# Patient Record
Sex: Male | Born: 1935 | Race: White | Hispanic: No | Smoking: Never smoker
Health system: Southern US, Community
[De-identification: ages and names within clinical notes are randomized; demographics above are authoritative.]

## PROBLEM LIST (undated history)

## (undated) DIAGNOSIS — Z9119 Patient's noncompliance with other medical treatment and regimen: Secondary | ICD-10-CM

## (undated) DIAGNOSIS — K219 Gastro-esophageal reflux disease without esophagitis: Secondary | ICD-10-CM

## (undated) DIAGNOSIS — E785 Hyperlipidemia, unspecified: Secondary | ICD-10-CM

## (undated) DIAGNOSIS — I10 Essential (primary) hypertension: Secondary | ICD-10-CM

## (undated) DIAGNOSIS — N182 Chronic kidney disease, stage 2 (mild): Secondary | ICD-10-CM

## (undated) DIAGNOSIS — R001 Bradycardia, unspecified: Secondary | ICD-10-CM

## (undated) DIAGNOSIS — I480 Paroxysmal atrial fibrillation: Secondary | ICD-10-CM

## (undated) DIAGNOSIS — Z91199 Patient's noncompliance with other medical treatment and regimen due to unspecified reason: Secondary | ICD-10-CM

## (undated) HISTORY — DX: Hyperlipidemia, unspecified: E78.5

## (undated) HISTORY — DX: Bradycardia, unspecified: R00.1

## (undated) HISTORY — DX: Essential (primary) hypertension: I10

## (undated) HISTORY — DX: Gastro-esophageal reflux disease without esophagitis: K21.9

---

## 2001-05-03 HISTORY — PX: COLONOSCOPY: SHX174

## 2002-04-10 ENCOUNTER — Encounter (INDEPENDENT_AMBULATORY_CARE_PROVIDER_SITE_OTHER): Payer: Self-pay | Admitting: Specialist

## 2002-04-10 ENCOUNTER — Ambulatory Visit (HOSPITAL_COMMUNITY): Admission: RE | Admit: 2002-04-10 | Discharge: 2002-04-10 | Payer: Self-pay | Admitting: *Deleted

## 2005-05-03 DIAGNOSIS — I480 Paroxysmal atrial fibrillation: Secondary | ICD-10-CM

## 2005-05-03 HISTORY — DX: Paroxysmal atrial fibrillation: I48.0

## 2005-06-17 ENCOUNTER — Encounter: Payer: Self-pay | Admitting: Internal Medicine

## 2005-06-17 ENCOUNTER — Inpatient Hospital Stay (HOSPITAL_COMMUNITY): Admission: EM | Admit: 2005-06-17 | Discharge: 2005-06-19 | Payer: Self-pay | Admitting: Internal Medicine

## 2005-06-17 ENCOUNTER — Encounter (INDEPENDENT_AMBULATORY_CARE_PROVIDER_SITE_OTHER): Payer: Self-pay | Admitting: *Deleted

## 2007-05-04 HISTORY — PX: CARDIAC CATHETERIZATION: SHX172

## 2007-07-09 ENCOUNTER — Ambulatory Visit: Payer: Self-pay | Admitting: Vascular Surgery

## 2007-07-09 ENCOUNTER — Encounter: Payer: Self-pay | Admitting: Internal Medicine

## 2007-07-09 ENCOUNTER — Inpatient Hospital Stay (HOSPITAL_COMMUNITY): Admission: EM | Admit: 2007-07-09 | Discharge: 2007-07-19 | Payer: Self-pay | Admitting: Emergency Medicine

## 2008-07-25 ENCOUNTER — Encounter: Payer: Self-pay | Admitting: Internal Medicine

## 2008-09-23 DIAGNOSIS — I498 Other specified cardiac arrhythmias: Secondary | ICD-10-CM | POA: Insufficient documentation

## 2008-09-24 ENCOUNTER — Ambulatory Visit: Payer: Self-pay | Admitting: Internal Medicine

## 2008-10-15 ENCOUNTER — Ambulatory Visit: Payer: Self-pay | Admitting: Internal Medicine

## 2008-10-15 ENCOUNTER — Ambulatory Visit: Payer: Self-pay

## 2008-11-22 ENCOUNTER — Ambulatory Visit: Payer: Self-pay | Admitting: Internal Medicine

## 2008-12-02 ENCOUNTER — Encounter: Admission: RE | Admit: 2008-12-02 | Discharge: 2008-12-02 | Payer: Self-pay | Admitting: Internal Medicine

## 2009-03-13 ENCOUNTER — Ambulatory Visit: Payer: Self-pay | Admitting: Internal Medicine

## 2009-05-03 HISTORY — PX: DOPPLER ECHOCARDIOGRAPHY: SHX263

## 2009-06-05 ENCOUNTER — Encounter: Payer: Self-pay | Admitting: Internal Medicine

## 2009-06-05 ENCOUNTER — Emergency Department (HOSPITAL_COMMUNITY): Admission: EM | Admit: 2009-06-05 | Discharge: 2009-06-05 | Payer: Self-pay | Admitting: Emergency Medicine

## 2009-06-06 ENCOUNTER — Ambulatory Visit: Payer: Self-pay | Admitting: Internal Medicine

## 2009-06-06 DIAGNOSIS — I10 Essential (primary) hypertension: Secondary | ICD-10-CM

## 2009-08-25 ENCOUNTER — Telehealth: Payer: Self-pay | Admitting: Internal Medicine

## 2009-09-08 ENCOUNTER — Ambulatory Visit: Payer: Self-pay | Admitting: Internal Medicine

## 2010-03-04 ENCOUNTER — Ambulatory Visit: Payer: Self-pay | Admitting: Internal Medicine

## 2010-05-03 HISTORY — PX: OTHER SURGICAL HISTORY: SHX169

## 2010-06-02 NOTE — Assessment & Plan Note (Signed)
Summary: per check out/sf   Visit Type:  Follow-up Primary Provider:  Dr.Pang   History of Present Illness: [name] returns today for followup.  He is a pleasant 77 yo man who still works two jobs.  He has a h/o persistent atrial fibrillation and has been maintained in NSR on flecainide.  He notes mild fatigue despite maintaining NSR.  He denies c/p or sob. Since I last saw him, his symptoms have improved but he is still working two jobs. He has purchased a bike and [place]  Current Medications (verified): 1)  Bystolic 5 Mg Tabs (Nebivolol Hcl) .... 1/2 Tablet Once Daily 2)  Sertraline Hcl 50 Mg Tabs (Sertraline Hcl) .Marland Kitchen.. 1 By Mouth Once Daily 3)  Vitamin E 600 Unit  Caps (Vitamin E) 4)  Multivitamins   Tabs (Multiple Vitamin) .Marland Kitchen.. 1 By Mouth Once Daily 5)  Flecainide Acetate 100 Mg Tabs (Flecainide Acetate) .... One By Mouth Two Times A Day 6)  Vitamin D 1000 Unit  Tabs (Cholecalciferol) .... Once Daily 7)  Prilosec Otc 20 Mg Tbec (Omeprazole Magnesium) .... Once Daily 8)  Pradaxa 150 Mg Caps (Dabigatran Etexilate Mesylate) .... Two Times A Day 9)  Bone   Tabs .... Once Daily  Allergies (verified): No Known Drug Allergies  Past History:  Past Medical History: Last updated: 09/23/2008 BRADYCARDIA (ICD-427.89) ATRIAL FIBRILLATION (ICD-427.31)    Review of Systems  The patient denies chest pain, syncope, dyspnea on exertion, and peripheral edema.    Vital Signs:  Patient profile:   77 year old male Height:      68 inches Weight:      216 pounds BMI:     32.96 Pulse rate:   52 / minute BP sitting:   122 / 70  (left arm)  Vitals Entered By: Laurance Flatten CMA (March 04, 2010 8:29 AM)  Physical Exam  General:  Well developed, well nourished, in no acute distress. Head:  normocephalic and atraumatic Eyes:  PERRLA/EOM intact; conjunctiva and lids normal. Mouth:  Teeth, gums and palate normal. Oral mucosa normal. Neck:  Neck supple,  no JVD. No masses, thyromegaly or abnormal cervical nodes. Lungs:  Clear bilaterally to auscultation.  No wheezes, rales, or rhonchi. Heart:  RRR with normal S1 and S2.  PMI is not enlarged or laterally displaced. Abdomen:  Bowel sounds positive; abdomen soft and non-tender without masses, organomegaly, or hernias noted. No hepatosplenomegaly. Msk:  Back normal, normal gait. Muscle strength and tone normal. Pulses:  pulses normal in all 4 extremities Extremities:  No clubbing or cyanosis. Neurologic:  Alert and oriented x 3.   EKG  Procedure date:  03/04/2010  Findings:      Sinus bradycardia with rate of:  52.  Impression & Recommendations:  Problem # 1:  ATRIAL FIBRILLATION (ICD-427.31) His symptoms are well controllled.  He will continue his current meds. He has stopped coumadin and is now on pradaxa. The following medications were removed from the medication list:    Coumadin 5 Mg Tabs (Warfarin sodium) .Marland KitchenMarland KitchenMarland KitchenMarland Kitchen 5 mg on mon.and fri.5 mg 10 mg other days His updated medication list for this problem includes:    Bystolic 5 Mg Tabs (Nebivolol hcl) .Marland Kitchen... 1/2 tablet once daily    Flecainide Acetate 100 Mg Tabs (Flecainide acetate) ..... One by mouth two times a day  Orders: EKG w/ Interpretation (93000)  Problem # 2:  ESSENTIAL HYPERTENSION, BENIGN (ICD-401.1) His blood pressure is well controlled.  He  will continue meds as below and maintain a low sodium diet. His updated medication list for this problem includes:    Bystolic 5 Mg Tabs (Nebivolol hcl) .Marland Kitchen... 1/2 tablet once daily  Patient Instructions: 1)  Your physician wants you to follow-up in: 12 months with Dr Court Joy will receive a reminder letter in the mail two months in advance. If you don't receive a letter, please call our office to schedule the follow-up appointment. 2)  Your physician recommends that you continue on your current medications as directed. Please refer to the Current Medication list given to you  today.

## 2010-06-02 NOTE — Letter (Signed)
Summary: [employer] Take Home Instructions  [facility] Take Home Instructions   Imported By: Roderic Ovens 06/13/2009 13:22:29  _____________________________________________________________________  External Attachment:    Type:   Image     Comment:   External Document

## 2010-06-02 NOTE — Assessment & Plan Note (Signed)
Summary: 3 month rov. sl   Visit Type:  3 mos /  OV Primary Provider:  Dr.Pang  CC:  a fib several wks ago.  History of Present Illness: William Stein returns today for followup.  He is a pleasant 77 yo man who still works two jobs.  He has a h/o persistent atrial fibrillation and has been maintained in NSR on flecainide.  He notes mild fatigue despite maintaining NSR.  He denies c/p or sob. Since I last saw him, his symptoms have improved but he is still working two jobs.   Problems Prior to Update: 1)  Essential Hypertension, Benign  (ICD-401.1) 2)  Bradycardia  (ICD-427.89) 3)  Atrial Fibrillation  (ICD-427.31)  Current Medications (verified): 1)  Bystolic 10 Mg Tabs (Nebivolol Hcl) .... Half  By Mouth Once Daily 2)  Coumadin 5 Mg Tabs (Warfarin Sodium) .... 5 Mg On Mon.and Fri.5 Mg 10 Mg Other Days 3)  Sertraline Hcl 50 Mg Tabs (Sertraline Hcl) .Marland Kitchen.. 1 By Mouth Once Daily 4)  Vitamin E 600 Unit  Caps (Vitamin E) .... 400 Unit Daily 5)  Multivitamins   Tabs (Multiple Vitamin) .Marland Kitchen.. 1 By Mouth Once Daily 6)  Flecainide Acetate 100 Mg Tabs (Flecainide Acetate) .... One By Mouth Two Times A Day 7)  Vitamin D 1000 Unit  Tabs (Cholecalciferol) .... Once Daily 8)  Prilosec Otc 20 Mg Tbec (Omeprazole Magnesium) .... Once Daily  Allergies (verified): No Known Drug Allergies  Past History:  Past Medical History: Last updated: 09/23/2008 BRADYCARDIA (ICD-427.89) ATRIAL FIBRILLATION (ICD-427.31)    Review of Systems  The patient denies chest pain, syncope, dyspnea on exertion, and peripheral edema.    Vital Signs:  Patient profile:   77 year old male Height:      68 inches Weight:      213 pounds BMI:     32.50 Pulse rate:   54 / minute BP sitting:   128 / 70  (left arm) Cuff size:   large  Vitals Entered By: William Stein (Sep 08, 2009 9:51 AM)  Physical Exam  General:  Well developed, well nourished, in no acute distress. Head:  normocephalic and atraumatic Eyes:   PERRLA/EOM intact; conjunctiva and lids normal. Mouth:  Teeth, gums and palate normal. Oral mucosa normal. Neck:  Neck supple, no JVD. No masses, thyromegaly or abnormal cervical nodes. Lungs:  Clear bilaterally to auscultation.  No wheezes, rales, or rhonchi. Heart:  RRR with normal S1 and S2.  PMI is not enlarged or laterally displaced. Abdomen:  Bowel sounds positive; abdomen soft and non-tender without masses, organomegaly, or hernias noted. No hepatosplenomegaly. Msk:  Back normal, normal gait. Muscle strength and tone normal. Pulses:  pulses normal in all 4 extremities Extremities:  No clubbing or cyanosis. Neurologic:  Alert and oriented x 3.   EKG  Procedure date:  09/08/2009  Findings:      Normal sinus rhythm with rate of:  62.  Impression & Recommendations:  Problem # 1:  ATRIAL FIBRILLATION (ICD-427.31) His symptoms are fairly well controlled for now. He will continue his current meds. His updated medication list for this problem includes:    Bystolic 10 Mg Tabs (Nebivolol hcl) ..... Half  by mouth once daily    Coumadin 5 Mg Tabs (Warfarin sodium) .Marland KitchenMarland KitchenMarland KitchenMarland Kitchen 5 mg on mon.and fri.5 mg 10 mg other days    Flecainide Acetate 100 Mg Tabs (Flecainide acetate) ..... One by mouth two times a day  Problem # 2:  BRADYCARDIA (ICD-427.89)  He appears to be asymptomatic for now despite his medical therapy. His updated medication list for this problem includes:    Bystolic 10 Mg Tabs (Nebivolol hcl) ..... Half  by mouth once daily    Coumadin 5 Mg Tabs (Warfarin sodium) .Marland KitchenMarland KitchenMarland KitchenMarland Kitchen 5 mg on mon.and fri.5 mg 10 mg other days    Flecainide Acetate 100 Mg Tabs (Flecainide acetate) ..... One by mouth two times a day  Problem # 3:  ATRIAL FIBRILLATION (ICD-427.31) He appears to be maintaining NSR.  Continue his current meds. His updated medication list for this problem includes:    Bystolic 10 Mg Tabs (Nebivolol hcl) ..... Half  by mouth once daily    Coumadin 5 Mg Tabs (Warfarin sodium) .Marland KitchenMarland KitchenMarland KitchenMarland Kitchen 5  mg on mon.and fri.5 mg 10 mg other days    Flecainide Acetate 100 Mg Tabs (Flecainide acetate) ..... One by mouth two times a day  Patient Instructions: 1)  Your physician wants you to follow-up in:   6 months with Dr Ladona Ridgel.  You will receive a reminder letter in the mail two months in advance. If you don't receive a letter, please call our office to schedule the follow-up appointment. 2)  Your physician recommends that you continue on your current medications as directed. Please refer to the Current Medication list given to you today.

## 2010-06-02 NOTE — Assessment & Plan Note (Signed)
Summary: rov per pt call/went to ER last night/lg   Visit Type:  Follow-up Primary Provider:  Dr.Pang  CC:  Pt was in ED on Wed for AF.  History of Present Illness: William Stein returns today for followup.  He is a pleasant 77 yo man who still works two jobs.  He has a h/o persistent atrial fibrillation and has been maintained in NSR on flecainide.  He notes mild fatigue despite maintaining NSR.  He denies c/p or sob.  No peripheral edema.  He recently had worsening atrial fibrillation and had to go to the ER for treatment.  He states that he had increased his Caffiene consumption and had been eating chocolate.  No other symptoms except for fatigue.  Current Medications (verified): 1)  Bystolic 10 Mg Tabs (Nebivolol Hcl) .... Half  By Mouth Once Daily 2)  Coumadin 5 Mg Tabs (Warfarin Sodium) .... As Directed 3)  Sertraline Hcl 50 Mg Tabs (Sertraline Hcl) .Marland Kitchen.. 1 By Mouth Once Daily 4)  Clonazepam Odt 0.5 Mg Tbdp (Clonazepam) .Marland Kitchen.. 1 By Mouth Once Daily 5)  Vitamin E 600 Unit  Caps (Vitamin E) .... 400 Unit Daily 6)  Multivitamins   Tabs (Multiple Vitamin) .Marland Kitchen.. 1 By Mouth Once Daily 7)  Flecainide Acetate 100 Mg Tabs (Flecainide Acetate) .... One By Mouth Two Times A Day 8)  Vitamin D 1000 Unit  Tabs (Cholecalciferol) .... Once Daily 9)  Prilosec Otc 20 Mg Tbec (Omeprazole Magnesium)  Allergies (verified): No Known Drug Allergies  Past History:  Past Medical History: Last updated: 09/23/2008 BRADYCARDIA (ICD-427.89) ATRIAL FIBRILLATION (ICD-427.31)    Review of Systems       The patient complains of dyspnea on exertion.  The patient denies chest pain, syncope, and peripheral edema.    Vital Signs:  Patient profile:   77 year old male Height:      68 inches Weight:      221 pounds BP sitting:   124 / 75  (right arm)  Physical Exam  General:  Well developed, well nourished, in no acute distress. Head:  normocephalic and atraumatic Eyes:  PERRLA/EOM intact; conjunctiva and  lids normal. Mouth:  Teeth, gums and palate normal. Oral mucosa normal. Neck:  Neck supple, no JVD. No masses, thyromegaly or abnormal cervical nodes. Lungs:  Clear bilaterally to auscultation.  No wheezes, rales, or rhonchi. Heart:  RRR with normal S1 and S2.  PMI is not enlarged or laterally displaced. Abdomen:  Bowel sounds positive; abdomen soft and non-tender without masses, organomegaly, or hernias noted. No hepatosplenomegaly. Msk:  Back normal, normal gait. Muscle strength and tone normal. Pulses:  pulses normal in all 4 extremities Extremities:  No clubbing or cyanosis. Neurologic:  Alert and oriented x 3.   EKG  Procedure date:  06/06/2009  Findings:      Sinus bradycardia with rate of:  58. LAE.  Impression & Recommendations:  Problem # 1:  ATRIAL FIBRILLATION (ICD-427.31) Assessment Deteriorated His problem has worsened.  I have outlined several treatment options including changing diet back to no caffiene, increasing flecainide, switching to another anti-arrhythmic drug or catheter ablation.  He would like to try and work on his diet.  Further I have asked him to take an additional bystolic if his heart goes back out of rhythm. His updated medication list for this problem includes:    Bystolic 10 Mg Tabs (Nebivolol hcl) ..... Half  by mouth once daily    Coumadin 5 Mg Tabs (Warfarin sodium) .Marland Kitchen... As  directed    Flecainide Acetate 100 Mg Tabs (Flecainide acetate) ..... One by mouth two times a day  Problem # 2:  BRADYCARDIA (ICD-427.89) His bradycardia is not currently symptomatic.  We will followup in several months. His updated medication list for this problem includes:    Bystolic 10 Mg Tabs (Nebivolol hcl) ..... Half  by mouth once daily    Coumadin 5 Mg Tabs (Warfarin sodium) .Marland Kitchen... As directed    Flecainide Acetate 100 Mg Tabs (Flecainide acetate) ..... One by mouth two times a day  Problem # 3:  ESSENTIAL HYPERTENSION, BENIGN (ICD-401.1) His blood pressure has  been reviewed.  He takes them at home.  He will continue on his current meds and maintain a low sodium diet. His updated medication list for this problem includes:    Bystolic 10 Mg Tabs (Nebivolol hcl) ..... Half  by mouth once daily  Patient Instructions: 1)  Your physician recommends that you schedule a follow-up appointment in: 3 months

## 2010-06-02 NOTE — Progress Notes (Signed)
Summary: pt has questions regarding b/p going up and down  Phone Note Call from Patient Call back at Home Phone [phone]   Caller: Patient Reason for Call: Talk to Nurse, Talk to Doctor Summary of Call: per pt call his b/p has been going up and down and he is concerned and has some questions. his a- fib started back up yesterday. Initial call taken by: Omer Jack,  August 25, 2009 5:02 PM  Follow-up for Phone Call        spoke with pt due to his HR being irreg the automatic cuff not picking up on BP.  He feels fine.  He will call me back if the afib doesn't break.  He drank 3 cups of regular coffee and had 2 pieces of chocolate cake prior to going out of rhythm.  He will let me know if he doesn't convert back. Dennis Bast, RN, BSN  August 25, 2009 5:24 PM

## 2010-07-08 ENCOUNTER — Emergency Department (HOSPITAL_COMMUNITY)
Admission: EM | Admit: 2010-07-08 | Discharge: 2010-07-08 | Disposition: A | Discharge status: Home / Self Care | Payer: PRIVATE HEALTH INSURANCE | Attending: Emergency Medicine | Admitting: Emergency Medicine

## 2010-07-08 ENCOUNTER — Emergency Department (HOSPITAL_COMMUNITY): Payer: PRIVATE HEALTH INSURANCE

## 2010-07-08 DIAGNOSIS — I10 Essential (primary) hypertension: Secondary | ICD-10-CM | POA: Insufficient documentation

## 2010-07-08 DIAGNOSIS — N390 Urinary tract infection, site not specified: Secondary | ICD-10-CM | POA: Insufficient documentation

## 2010-07-08 DIAGNOSIS — R5383 Other fatigue: Secondary | ICD-10-CM | POA: Insufficient documentation

## 2010-07-08 DIAGNOSIS — R5381 Other malaise: Secondary | ICD-10-CM | POA: Insufficient documentation

## 2010-07-08 DIAGNOSIS — R7309 Other abnormal glucose: Secondary | ICD-10-CM | POA: Insufficient documentation

## 2010-07-08 DIAGNOSIS — I4891 Unspecified atrial fibrillation: Secondary | ICD-10-CM | POA: Insufficient documentation

## 2010-07-08 LAB — URINALYSIS, ROUTINE W REFLEX MICROSCOPIC
Hgb urine dipstick: NEGATIVE
Leukocytes, UA: NEGATIVE
Nitrite: NEGATIVE
Protein, ur: 30 mg/dL — AB
Specific Gravity, Urine: 1.037 — ABNORMAL HIGH (ref 1.005–1.030)
Urobilinogen, UA: 1 mg/dL (ref 0.0–1.0)

## 2010-07-08 LAB — POCT I-STAT, CHEM 8
Creatinine, Ser: 1.4 mg/dL (ref 0.4–1.5)
Glucose, Bld: 140 mg/dL — ABNORMAL HIGH (ref 70–99)

## 2010-07-08 LAB — URINE MICROSCOPIC-ADD ON

## 2010-07-09 LAB — URINE CULTURE: Culture  Setup Time: 201203071436

## 2010-07-22 LAB — PROTIME-INR
INR: 2.86 — ABNORMAL HIGH (ref 0.00–1.49)
Prothrombin Time: 29.8 seconds — ABNORMAL HIGH (ref 11.6–15.2)

## 2010-07-22 LAB — POCT I-STAT, CHEM 8
BUN: 16 mg/dL (ref 6–23)
HCT: 46 % (ref 39.0–52.0)
Potassium: 4.1 mEq/L (ref 3.5–5.1)
Sodium: 138 mEq/L (ref 135–145)
TCO2: 23 mmol/L (ref 0–100)

## 2010-09-15 NOTE — Cardiovascular Report (Signed)
NAME:  William Stein, William Stein NO.:  [id number]   MEDICAL RECORD NO.:  [id number]          PATIENT TYPE:  INP   LOCATION:  1408                         FACILITY:  [facility]   PHYSICIAN:  Antionette Char, MD    DATE OF BIRTH:  01-09-1931   DATE OF PROCEDURE:  07/11/2007  DATE OF DISCHARGE:                            CARDIAC CATHETERIZATION   PROCEDURES:  1. Left heart catheterization.  2. Coronary cineangiography.  3. Left ventricular angiography.  4. Abdominal aortogram.  5. Angio-Seal of the right femoral artery.   INDICATIONS FOR PROCEDURE:  This 77 year old male has a history of  paroxysmal atrial fibrillation since February 2007.  He was readmitted  at this time with increasing shortness of breath and palpitations with  chest discomfort.  He has had extreme fatigue for a month.  On admission  to [facility], he was noted to be in atrial fibrillation with  a rapid ventricular response.  He was started on a diltiazem drip and a  beta-blocker and he converted to a sinus rhythm in the first 24 hours.  His chest discomfort was relieved.  A cardiac consultation by [facility] recommended a cardiac cath to assess his coronary  status and if this was stable then he may be a candidate for an EP study  to consider atrial fib ablation.  He also has a long history of  hypertension which has been inadequately controlled.   PROCEDURE:  After signing an informed consent the patient was  transported to [facility] cardiac cath lab where his right groin was  prepped and draped in a sterile fashion and anesthetized locally with 1%  lidocaine.  A 5-French introducer sheath was inserted percutaneously  into the right femoral artery.  A 5-French #4 Judkins coronary catheters  were used to make injections into the coronary arteries.  A 5-French  pigtail catheter was used to measure pressures in the left ventricle and  aorta and to make a midstream injection into  the left ventricle and  abdominal aorta.  The patient tolerated the procedure well and no  complications were noted.  At the end of the procedure the catheter and  sheath were removed from the right femoral artery and hemostasis was  easily obtained with an Angio-Seal closure system.   MEDICATIONS GIVEN:  None.   HEMODYNAMIC DATA:  There was no gradient across the aortic valve.  The  systolic pressure ranged in the 140-150 range.   CINE FINDINGS:  Coronary cineangiography - left coronary artery.  The  ostium and left main appear normal.  The bifurcation appears normal.   Left anterior descending.  There is mild ectasia in the proximal LAD,  otherwise the LAD appears normal.   Circumflex coronary artery appears normal.   Right coronary artery.  There is mild ectasia in the proximal segment,  otherwise the right coronary artery appears normal.  There is normal  flow throughout all the coronary distributions with normal antegrade  flow and normal distal run-off.   Left ventricular cine angiogram.  The left ventricular chamber size,  contractility and wall thickness appear  normal.  The ejection fraction  was estimated at 60%.  The mitral and aortic valves appear normal.   Abdominal aortogram.  The abdominal aorta, renal and iliac arteries  appear normal   FINAL DIAGNOSIS:  1. 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  2. Normal coronary arteries otherwise with normal antegrade flow and      normal distal run-off.  3. Normal left ventricular function with normal ejection fraction of      60%  4. Normal abdominal aorta, renal and iliac arteries.  5. Successful Angio-Seal of the right femoral artery.   DISPOSITION:  Will return the patient to [facility] where we  will continue to treat his paroxysmal atrial fibrillation with loading  with Coumadin, giving aspirin today and continuing on heparin until his   Coumadin level is therapeutic.  Will also restart on a low dose of  Cardizem by mouth as it has been held intravenously because of a low  blood pressure.      Antionette Char, MD  Electronically Signed     JRT/MEDQ  D:  07/11/2007  T:  07/12/2007  Job:  [id number]   cc:   Antionette Char, MD  [facility]

## 2010-09-15 NOTE — Discharge Summary (Signed)
NAME:  DJON, TITH NO.:  [id number]   MEDICAL RECORD NO.:  [id number]          PATIENT TYPE:  INP   LOCATION:  1408                         [facility]   PHYSICIAN:  Thomasenia Bottoms, MDDATE OF BIRTH:  1930/11/05   DATE OF ADMISSION:  07/09/2007  DATE OF DISCHARGE:                               DISCHARGE SUMMARY   ADDENDUM:  Addition to the discharge summary; the initial discharge  summary was dictated yesterday, July 18, 2007.  This is an addendum.  He is being discharged on July 19, 2007.   The patient will be discharged on 5 mg of Coumadin two tablets every day  which was the dose that he took last year when he was taking Coumadin.   He has a followup appointment in 48 hours with Dr. Adelene Idler office to  check his heart rate and his INR.  The patient is feeling well this  morning and has no complaints.  His INR today is 2.3, and his heart rate  is ranging from 51-62.  The patient should certainly continue the grief  support group that he is attending at this time.      Thomasenia Bottoms, MD  Electronically Signed     CVC/MEDQ  D:  07/19/2007  T:  07/19/2007  Job:  [id number]   cc:   Antionette Char, MD  Fax: [phone]   Juline Patch, M.D.  Fax: [phone]

## 2010-09-15 NOTE — Discharge Summary (Signed)
NAME:  William Stein, William Stein NO.:  [id number]   MEDICAL RECORD NO.:  [id number]          PATIENT TYPE:  INP   LOCATION:  1408                         FACILITY:  [facility]   PHYSICIAN:  Elliot Cousin, M.D.    DATE OF BIRTH:  1930/08/23   DATE OF ADMISSION:  07/09/2007  DATE OF DISCHARGE:  07/19/2007                               DISCHARGE SUMMARY   DISCHARGE DIAGNOSES:  1. Paroxysmal atrial fibrillation with rapid ventricular response.  2. Bradycardia, thought to be secondary to Cardizem and metoprolol.   DISCHARGE MEDICATIONS:  1. Coumadin, home dose not confirmed yet, possibly 10 mg daily.  2. Cardizem 30 mg t.i.d.  3. MiraLax laxative take as directed.  4. Vitamin E 400 units daily.  5. Multivitamin once daily.  6. Stop aspirin.   DISCHARGE DISPOSITION:  The patient is approaching medical stability.  It is anticipated that he will be discharged home tomorrow if his INR  remains between 1.9 and 3.0.  The patient has a hospital follow-up  appointment scheduled to see Dr. Adelene Idler N.P., [place], on Friday  July 21, 2007 at 1:45 p.m..  He will need blood work and his heart rate  reassessed then.   CONSULTATIONS:  Charolette Child, M.D.   PROCEDURES PERFORMED:  1. Cardiac catheterization on July 11, 2007.  The results revealed      mild ectasia in the proximal left anterior descending coronary      artery and right coronary arteries.  Normal coronary arteries      otherwise.  Ejection fraction 60%.  Normal abdominal aorta, renal      and iliac arteries.  2. CT scan of the chest on July 09, 2007.  The results revealed no      evidence of pulmonary emboli.  Coronary artery calcifications.  No      acute pulmonary findings.  Degenerative changes in the thoracic      spine.  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   HISTORY OF PRESENT ILLNESS:  The patient is a 77 year old man with a  past medical history  significant for paroxysmal atrial fibrillation, who  presented to the emergency department on July 09, 2007 with a chief  complaint of shortness of breath.  When the patient was evaluated in the  emergency department, his heart rate was noted to be 159.  His EKG  revealed atrial fibrillation.  The patient was therefore admitted for  further evaluation and management.   For additional details, please see the dictated history and physical.   HOSPITAL COURSE:  1. PAROXYSMAL ATRIAL FIBRILLATION, RAPID VENTRICULAR RESPONSE ON      ADMISSION, AND NOW SINUS BRADYCARDIA.  The patient was started on a      Cardizem drip.  Intravenous heparin was started, as well.  Cardiac      enzymes were ordered, as well as other lab studies.  The patient's      cardiac enzymes were essentially negative.  His TSH was within      normal limits at 1.7.  His  BNP was minimally elevated at 168.  His      magnesium level was assessed and was within normal limits at 2.3.      His fasting lipid panel revealed a total cholesterol of 123,      triglycerides of 90, HDL of 21 and LDL of 84.  For further      evaluation, cardiologist, Dr. Aleen Campi, was consulted.  Dr.      Aleen Campi agreed with medical management. Dr. Aleen Campi decided to      evaluate the patient further with a cardiac catheterization.  The      cardiac catheterization was performed on July 11, 2007, and, in      essence, it revealed normal coronary arteries with the exception of      mild ectasia.  Over the course of the hospitalization, the Cardizem      drip was titrated off.  The patient was subsequently started on      metoprolol and Cardizem orally.   Over the past 24-48 hours, the patient has become bradycardic.  Therefore, the metoprolol will be discontinued, and the patient will  remain on Cardizem 30 mg t.i.d..  The patient's blood pressure has been  excellent throughout the hospital course.  Currently, his heart rate is  ranging from 53 beats  per minute to 55 beats per minute on his current  regimen of metoprolol 12.5 mg t.i.d. and Cardizem CD 180 mg daily.  As  stated above, the metoprolol will be discontinued, and the Cardizem will  be changed to a t.i.d. dosing.  His INR yesterday was 1.9 and it is 1.9  today.  Over the past couple of days, he has received 10 mg of Coumadin  each day.  Initially, the anticoagulation effects of coumadin were slow.  However, over the past couple of days, his INR has approached the  therapeutic range.   If the patient's INR remains greater than or equal to 1.9, I anticipate  that the patient will be discharged to home on July 19, 2007.  The  patient was given a hospital follow-up appointment to see Dr. Adelene Idler  nurse practitioner, [place], on Friday, July 21, 2007 at 1:45 p.m.  The  patient has no complaints of chest pain, shortness of breath, dizziness  or lightheadedness.   CURRENT LABORATORIES:  PT 22.4, INR 1.9.  WBC 6.2, hemoglobin 14.4,  platelets 221.      Elliot Cousin, M.D.  Electronically Signed     DF/MEDQ  D:  07/18/2007  T:  07/18/2007  Job:  [id number]   cc:   Juline Patch, M.D.  Fax: [phone]   Antionette Char, MD  Fax: [phone]

## 2010-09-15 NOTE — H&P (Signed)
NAME:  William Stein, William Stein NO.:  [id number]   MEDICAL RECORD NO.:  [id number]          PATIENT TYPE:  INP   LOCATION:  1235                         FACILITY:  [facility]   PHYSICIAN:  Isidor Holts, M.D.  DATE OF BIRTH:  [birth year]   DATE OF ADMISSION:  07/09/2007  DATE OF DISCHARGE:                              HISTORY & PHYSICAL   PRIMARY CARE PHYSICIAN:  Dr. Juline Patch.   PRIMARY CARDIOLOGIST:  Dr. Elvera Lennox. Tysinger.   CHIEF COMPLAINT:  Increasing shortness of breath and exertion,  intermittent palpitations and chest discomfort, as well as extreme  fatigue for 1 month.   HISTORY OF PRESENT ILLNESS:  This is a 77 year old male.  For past  medical history, see below.  The patient was diagnosed with paroxysmal  atrial fibrillation on June 04, 2005 and commenced on Cardizem and  Coumadin at that time.  According to the patient, his spouse has been  very unwell in the past year, with complex cardiac and peripheral  vascular issues, requiring frequent hospitalizations lately. She finally  passed away in 2007-06-04. During the course of spouses recent  hospitalizations, the patient has paid less and less attention to his  own medical problems.  He stopped seeing his primary MD, failed to see  his cardiologist, and discontinued his Cardizem and Coumadin about 3-4  months ago.  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 He continues  to drink about 4-5 cups of coffee per day. The above-mentioned symptoms  have escalated over the past 1 month, and at about 3:00 a.m. on July 09, 2007, while at work at [employer], the patient states that his  heart started racing.  He asked an RN who was on duty at that time, to  check his heart rate. She found it to be very fast and she recommended  an ED visit.  Subsequently, about 10:00  a.m., the patient got his  daughter to bring him to the emergency department. On initial evaluation  in the emergency department, his pulse rate was found to be quite normal  at about 64 per minute and his 12-lead EKG showed normal sinus rhythm.  However during the course of this evaluation, he suddenly went into  tachycardia with a rate of about 159.  Rhythm strip and 12-lead EKG  demonstrated fast atrial fibrillation with a ventricular response rate  of 159.  The patient was referred to the medical service for admission.   PAST MEDICAL HISTORY:  1. Paroxysmal atrial fibrillation diagnosed February 2007.  2. Internal hemorrhoids noted on colonoscopy December 2003.  3. Recently widowed.   MEDICATION HISTORY:  1. Multivitamin 1 p.o. daily.  2. Vitamin E 400 units 1 p.o. daily.  3. Aspirin 81 mg p.o. daily.   ALLERGIES:  No known drug allergies.   REVIEW OF SYSTEMS:  As per HPI and chief complaint.  The patient denies  chest pain, although he does experience intermittent chest discomfort  that he  describes as heartburn.  He has experienced increasing  shortness of breath on exertion, effort tolerance is very poor.  Denies  lower extremity swelling, paroxysmal nocturnal dyspnea, abdominal pain,  vomiting or diarrhea.  Denies fever.   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  Nonsmoker,  nondrinker.  He was recently widowed, as his wife passed away in 06/09/2007. He has one daughter who lives in a [place] and  one son who lives in [place], [place].  He has no history of drug abuse.  He drinks a lot of coffee however, drinking about 4-5 cups of coffee per  day.   FAMILY HISTORY:  The patient's father died at age 42 years with senile  dementia.  His mother died at age 38 years from old age.  There is no  family history of coronary artery disease.   PHYSICAL EXAMINATION:  VITALS:   Temperature 97.7, pulse initially 64 per  minute, regular, subsequently 140 per minute and irregular.  BP 110/62  mmHg, pulse oximeter 98% on room air.  The patient appears somewhat  anxious otherwise in no obvious acute distress, alert, communicative,  not short of breath at rest.  HEENT:  No clinical pallor or jaundice.  No conjunctival injection.  NECK:  Supple.  JVP not seen.  No palpable lymphadenopathy.  No palpable  goiter.  No carotid bruits.  CHEST:  Clinically clear to auscultation.  No wheezes or crackles.  HEART:  Sounds 1 and 2 heard, normal, irregularly irregular.  No  murmurs.  ABDOMEN:  Flat, soft and nontender.  No palpable organomegaly or  palpable masses.  Normal bowel sounds.  LOWER EXTREMITY:  No pitting edema.  Palpable peripheral pulses.  MUSCULOSKELETAL:  Unremarkable.  CENTRAL NERVOUS SYSTEM:  No focal neurologic deficit on gross  examination.   LABORATORY DATA:  CBC, WBC 9.0, hemoglobin 15.4, hematocrit 43.3,  platelets 272.  Electrolytes, sodium 141.  Potassium 4.3, chloride 107,  CO2 26, BUN 16, creatinine 1.30. Glucose 97, AST 24, ALT 23, alkaline  phosphatase 39. Chest x-ray dated July 09, 2007 shows no active  cardiopulmonary disease.  A 12-lead EKG initially done April 09, 2008  showed sinus rhythm, regular 59 per minute,  normal axis, old Q-wave in  V1.  No acute ischemic changes.  Repeat 12-lead EKG done after the  patient developed tachycardia, showed rapid atrial fibrillation with a  ventricular response rate of 159 per minute.   ASSESSMENT AND PLAN:  1. Paroxysmal atrial fibrillation, now in rapid atrial fibrillation.      We shall admit the patient, institute intravenous infusion of      Cardizem and titrate against heart rate.  The patient has already      received a bolus injection of Cardizem in the emergency department.      We shall cycle cardiac enzymes to rule out acute coronary syndrome      and check TSH.  Meanwhile commence the  patient on intravenous      infusion of Heparin, arrange 2-D echocardiogram to assess LV      function and rule out intracardiac thrombus.  Commence the patient      on low-dose Aspirin.  The patient has in the past been seen by Dr.      Aleen Campi. We shall consult his group for further recommendations.   1. Shortness of breath on exertion.  This is  likely secondary to #1      above.  Clinically the patient has no evidence of congestive heart      failure.  However, as mentioned above, we shall do a 2-D      echocardiogram to assess LV function. Chest CT angiogram is also      indicated to rule out the possibility of pulmonary embolism. For      completeness, we shall also do bilateral lower extremity venous      Doppler.   1. Grief reaction/anxiety.  We shall place the patient on b.i.d. Xanax      for now.   Further management will depend on the clinical course.      Isidor Holts, M.D.  Electronically Signed     CO/MEDQ  D:  07/09/2007  T:  07/10/2007  Job:  [id number]   cc:   Juline Patch, M.D.  Fax: [phone]   Antionette Char, MD  Fax: [phone]

## 2010-09-18 NOTE — H&P (Signed)
NAME:  NASHUA, HOMEWOOD NO.:  [id number]   MEDICAL RECORD NO.:  [id number]          PATIENT TYPE:  EMS   LOCATION:  ED                           [facility]AC[facility]  [facility]   PHYSICIAN:  Lonia Blood, M.D.      DATE OF BIRTH:  [birth year]   DATE OF ADMISSION:  06/17/2005  DATE OF DISCHARGE:                                HISTORY & PHYSICAL   PRIMARY CARE PHYSICIAN:  Dr. Juline Patch   PRESENTING COMPLAINT:  Irregular heart beat.   HISTORY OF PRESENT ILLNESS:  The patient is a 77 year old gentleman  relatively healthy except for recent irregular heart beat for which he is  taking a heart pill. 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 He has been very stressed lately due to his  wife's illness. Tonight at work he started feeling dizzy and funny. His  heart was racing very fast. The patient said he did not pass out but he felt  really light headed and dizzy, hence a nurse took his blood pressure and  vitals at [facility] and asked him to come to the emergency room. He is  currently stable, awake, alert and able to give a history. Denied any chest  pain. No cough. No paroxysmal nocturnal dyspnea, no orthopnea. The patient  denied any prior heart disease except for the heart rhythm. The patient was  found to be in atrial fibrillation in the ED. He had been on a Cardizem drip  prior to my arrival and has been stable at this point.   PAST MEDICAL HISTORY:  1.  Is mainly irregular heart beat over the past 1 year for which he is      seeing Dr. Ricki Miller and is on a particular pill.  2.  The patient has had some insomnia that has been going on for the past      couple of months since his wife has been sick.  3.  Otherwise no significant medical history.   ALLERGIES:  No known drug allergies.   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  He denied any tobacco or alcohol use. However, patient drinks a lot of  coffee and caffeine due to his night shift and is under a lot of stress  lately. His wife has had multiple amputations and has had quadruple bypass  and has undergone serious medical problems over the past  6 years. He has been the primary caregiver and that has put a lot of stress  on him in addition to his job.   FAMILY HISTORY:  His father died at age 27. He had senile dementia but no  heart disease. Mother died at age 56 from old age. The patient denied any  family history of coronary artery disease or any heart disease in his  family.   REVIEW OF SYSTEMS:  An attempt at review of systems is performed and  essentially as in HPI.   PHYSICAL EXAMINATION:  VITAL SIGNS:  Temperature is 97.9, initial blood  pressure 114/67, pulse 120's to 160's up all the way to 170's. Respiratory  rate 19 to 26 and saturations are 97% on room air.  GENERAL:  The patient is awake, alert, very pleasant and looks young for his  age.  HEENT:  Pupils are equal, round and reactive to light and accommodation.  EOMI.  NECK:  Supple, no JVD, no lymphadenopathy.  RESPIRATORY:  He has good air entry bilaterally, no wheezes or rales.  CARDIOVASCULAR:  Irregularly irregular heart rhythm, with a rate of around  70.  ABDOMEN:  Soft and nontender with positive bowel sounds.  EXTREMITIES:  Show no edema, cyanosis or clubbing.   LABORATORY DATA:  Currently pending.   EKG showed atrial fibrillation with a rate of 169. QRS 74. QTC of 462.  Normal axis, no visible ST changes. Chest x-ray also currently pending.   ASSESSMENT:  This is a 77 year old gentleman that is presenting with atrial  fibrillation with rapid ventricular response. The patient has apparently had  some form of irregular heart rhythm over the past 1 year. He had no prior  history of cardiac disease. The patient however seems to be taking a lot of  caffeinated products to keep  himself awake due to his prolonged night shifts  and this may have contributed to his irregular heart rhythm. Other  possibility is intrinsic heart disease. Coronary artery disease cannot be  ruled out, being of male sex, with unknown cholesterol status.   PLAN:  1.  Atrial fibrillation with rapid ventricular response. Will admit patient      to a telemetry bed. He is already on a Cardizem drip which has      controlled his rate somewhat. Would convert him to oral Cardizem while      starting on anticoagulation. I will start him on both Lovenox and      Coumadin right now. Will also get a  2D echocardiogram and get cardiology involved. Hopefully that will help with  further risk stratification. I will check serial cardiac enzymes to rule out  coronary artery disease as well.  1.  Insomnia. This is for the most part due to a lot of stress and so much      caffeinated products. I will put patient on some p.r.n. Ambien at night      to help him with sleep, and then counsel him continuously.      Lonia Blood, M.D.  Electronically Signed     LG/MEDQ  D:  06/17/2005  T:  06/17/2005  Job:  [id number]   cc:   Juline Patch, M.D.  Fax: [phone]

## 2010-09-18 NOTE — Discharge Summary (Signed)
NAME:  William Stein, William Stein NO.:  [id number]   MEDICAL RECORD NO.:  [id number]          PATIENT TYPE:  INP   LOCATION:  6524                         FACILITY:  [facility]   PHYSICIAN:  Isidor Holts, M.D.  DATE OF BIRTH:  May 03, 1931   DATE OF ADMISSION:  06/17/2005  DATE OF DISCHARGE:  06/19/2005                                 DISCHARGE SUMMARY   DISCHARGE DIAGNOSIS:  Paroxysmal atrial fibrillation.   DISCHARGE MEDICATIONS:  1.  Aspirin 81 mg p.o. daily (over the counter).  2.  Cardizem CD 240 mg p.o. daily.  3.  Coumadin 5 mg p.o. daily, otherwise, as per INR. Dose to be adjusted by      primary medical doctor.   PROCEDURE:  1.  Chest x-ray dated June 18, 2005, showed no active cardiopulmonary      disease.  2.  2D echocardiogram dated June 17, 2005, showed overall normal left      ventricular systolic function, LVEF 55-65%, no left ventricular regional      wall motion abnormalities, left ventricular wall thickness was mildly      increased, overall parameters were consistent with abnormal left      ventricular relaxation, there was trivial tricuspid valve regurgitation,      heart valves were, otherwise, normal.   CONSULTATIONS:  Antionette Char, M.D., cardiologist   ADMISSION HISTORY:  As in H&P notes of June 17, 2005, dictated by Dr.  Lonia Blood. However, in brief, this is a 77 year old male, with no  significant past medical history, other than irregular heart beat for  which he was on an unspecified medication, who presents with palpitations.  The patient apparently is under a significant amount of stress, in  association with his wife's medical problems.  He also works as an  Airline pilot and also nights, as a Electrical engineer.  Because of his night job,  he drinks considerable amounts of coffee.  He presents with the above  mentioned symptoms, and light headedness.  On initial evaluation, he was  found to be in atrial fibrillation.  He was  commenced on a Cardizem drip and  admitted for further evaluation, investigation, and management.   CLINICAL COURSE:  Problem 1:  Paroxysmal atrial fibrillation.  The patient presented in fast  atrial fibrillation.  This was rate controlled with intravenous Cardizem  infusion and subsequently, he was switched to oral Cardizem and reverted to  sinus rhythm on this medication.  There was no clinical evidence of heart  failure.  Cardiology consultation was called, which was kindly provided by  Dr. Aleen Campi, who has recommended that the patient reduce his work load,  reduce his caffeine intake, and undergo an exercise tolerance test on an  outpatient basis.  2D echocardiogram dated June 17, 2005, showed normal  LV function and overall good cardiac function without any valvular  abnormality.  The patient has also been commenced on anti-coagulation.  Cardiac enzymes were cycled for completeness and these remained unelevated.  Lipid profile showed the following findings; total cholesterol 152,  triglycerides 111, HDL 35, LDL 95, i.e., excellent lipid profile.  TSH was  normal at 1.891.   DISPOSITION:  The patient was discharged in satisfactory condition on  June 19, 2005.  He is recommended to return to work on June 22, 2005.  Diet healthy heart diet.  Activity as tolerated.  The patient has  been strongly recommended to reduce his caffeine intake and work load.  Wound care not applicable.  Pain management  not applicable.   FOLLOW UP INSTRUCTIONS:  The patient is instructed to follow up with Dr.  Aleen Campi, cardiologist, telephone number [phone], for an outpatient  exercise tolerance test.  He is to follow up with his PMD, Dr. Juline Patch,  in 1-2 weeks.  The patient has also been instructed to call his PMD, Dr.  Ricki Miller, on June 21, 2005, in the a.m. to have his INR checked.  His  primary MD will adjust his Coumadin dosage accordingly and follow up  routinely.  All this has  been communicated to the patient who verbalized  understanding.      Isidor Holts, M.D.  Electronically Signed     CO/MEDQ  D:  06/19/2005  T:  06/19/2005  Job:  [id number]   cc:   Juline Patch, M.D.  Fax: [phone]   Antionette Char, MD  Fax: [phone]

## 2010-09-18 NOTE — Op Note (Signed)
   NAME:  William, Stein NO.:  [id number]   MEDICAL RECORD NO.:  [id number]                   PATIENT TYPE:  AMB   LOCATION:  ENDO                                 FACILITY:  [facility]   PHYSICIAN:  Georgiana Spinner, M.D.                 DATE OF BIRTH:  Feb 08, 1931   DATE OF PROCEDURE:  DATE OF DISCHARGE:                                 OPERATIVE REPORT   PROCEDURE:  Colonoscopy.   INDICATIONS FOR PROCEDURE:  Hemoccult positivity, colon cancer screening.   ANESTHESIA:  Demerol 30, Versed 3 mg.   DESCRIPTION OF PROCEDURE:  With the patient mildly sedated in the left  lateral decubitus position, a rectal examination was performed which was  unremarkable. There was trace positive material. Subsequently the Olympus  videoscopic colonoscope was inserted in the rectum and passed under direct  vision to the cecum identified by the ileocecal valve. We visualized the  crows foot of the cecum. A photograph was not taken. From this point, the  colonoscope was slowly withdrawn taking circumferential views of the entire  colonic mucosa stopping only then in the rectum which appeared normal in  direct and showed hemorrhoids in retroflexed view. The endoscope was  straightened and withdrawn. The patient's vital signs and pulse oximeter  remained stable. The patient tolerated the procedure well without apparent  complications.   FINDINGS:  Internal hemorrhoids, otherwise, unremarkable exam.   PLAN:  Repeat examination in 5-10 years.                                               Georgiana Spinner, M.D.    GMO/MEDQ  D:  04/10/2002  T:  04/10/2002  Job:  [id number]

## 2010-09-18 NOTE — Op Note (Signed)
   NAME:  William Stein, William Stein NO.:  [id number]   MEDICAL RECORD NO.:  [id number]                   PATIENT TYPE:  AMB   LOCATION:  ENDO                                 FACILITY:  [facility]   PHYSICIAN:  Georgiana Spinner, M.D.                 DATE OF BIRTH:  December 24, 1930   DATE OF PROCEDURE:  04/10/2002  DATE OF DISCHARGE:                                 OPERATIVE REPORT   PROCEDURE:  Upper endoscopy.   INDICATIONS:  GERD.   ANESTHESIA:  Demerol 60 mg, Versed 6 mg.   DESCRIPTION OF PROCEDURE:  With the patient mildly sedated in the left  lateral decubitus position, the Olympus videoscopic endoscope was inserted  into the mouth and passed under direct vision through the esophagus, which  appeared normal into the stomach. Fundus body antrum appeared somewhat  erythematous and photographs and biopsies taken.  Duodenal bulb and second  portion duodenum appeared normal with only mild inflammation seen in the  duodenal bulb.  From this point the endoscope was slowly withdrawn, taking  circumferential views of the entire duodenal mucosa to the endoscope then  pulled into the stomach, placed in retroflexion to view the stomach from  below.  The endoscope was straightened and withdrawn taking circumferential  views of the remaining gastric and esophageal mucosa.  The patient's vital  signs and pulse oximeter remained stable.  The patient tolerated the  procedure well with no apparent complications.   FINDINGS:  Erythema of gastric wall.   PLAN:  Await biopsy report.  The patient will call me for results and  followup with me as an outpatient.  The patient is to proceed with  colonoscopy as planned.                                               Georgiana Spinner, M.D.    GMO/MEDQ  D:  04/10/2002  T:  04/10/2002  Job:  [id number]

## 2010-10-27 ENCOUNTER — Other Ambulatory Visit: Payer: Self-pay | Admitting: Internal Medicine

## 2010-10-28 ENCOUNTER — Telehealth: Payer: Self-pay | Admitting: Internal Medicine

## 2010-10-28 NOTE — Telephone Encounter (Signed)
Pt called and had seen his pcp but advised him to call us.  BP is running low and pulse is 89 and atrial fib is acting up.  Feels very weak x several days.  Please call patient and advise what he should do .   Dr Ricki Miller D/c Bystolic.  Has been worse since then.

## 2010-10-28 NOTE — Telephone Encounter (Signed)
Spoke with Dr Ricki Miller He stopped his Bystolic several weeks ago due to pt stating no energy  He feels like he feels worse off the medications than before  He is going to restart medication at 1/2 dose and I will dsicuss with Dr Ladona Ridgel and call him back on Friday

## 2010-10-30 NOTE — Telephone Encounter (Signed)
C/O. B/p this am 71/59. afib now, weak. Dizzy.

## 2010-10-30 NOTE — Telephone Encounter (Signed)
Will set up an appointment for pt to see Dr Ladona Ridgel on Tues at 8:45  If he starts to feel worse may go to the ER  Pt aware

## 2010-11-02 ENCOUNTER — Encounter: Payer: Self-pay | Admitting: Internal Medicine

## 2010-11-03 ENCOUNTER — Ambulatory Visit (INDEPENDENT_AMBULATORY_CARE_PROVIDER_SITE_OTHER): Payer: PRIVATE HEALTH INSURANCE | Admitting: Internal Medicine

## 2010-11-03 ENCOUNTER — Encounter: Payer: Self-pay | Admitting: Internal Medicine

## 2010-11-03 ENCOUNTER — Encounter: Payer: Self-pay | Admitting: *Deleted

## 2010-11-03 VITALS — BP 116/71 | HR 55 | Ht 67.0 in | Wt 214.0 lb

## 2010-11-03 DIAGNOSIS — R0600 Dyspnea, unspecified: Secondary | ICD-10-CM

## 2010-11-03 DIAGNOSIS — R0609 Other forms of dyspnea: Secondary | ICD-10-CM

## 2010-11-03 DIAGNOSIS — I498 Other specified cardiac arrhythmias: Secondary | ICD-10-CM

## 2010-11-03 DIAGNOSIS — I4891 Unspecified atrial fibrillation: Secondary | ICD-10-CM

## 2010-11-03 NOTE — Patient Instructions (Signed)
Your physician has requested that you have an exercise tolerance test. For further information please visit [email or web address]. Please also follow instruction sheet, as given.   

## 2010-11-03 NOTE — Progress Notes (Signed)
HPI [name] returns today for followup. He is a pleasant 77 year old man with a history of paroxysmal atrial fibrillation, sinus bradycardia, hypertension, and dyslipidemia. Despite his advanced age, he continues to work 2 jobs. Over the last several weeks, the patient has noted increasing fatigue weakness and shortness of breath. He has no specific complaints other than these. He specifically denies chest pain. The patient has seen his primary physician and had multiple blood studies done, the results of which are pending. He denies peripheral edema, PND, and orthopnea. No syncope. He continues to have occasional palpitations but does not think that his atrial fibrillation has increased in frequency or severity. No Known Allergies   Current Outpatient Prescriptions  Medication Sig Dispense Refill  . dabigatran (PRADAXA) 150 MG CAPS Take 150 mg by mouth 2 (two) times daily.        . flecainide (TAMBOCOR) 100 MG tablet TAKE ONE TABLET BY MOUTH TWICE DAILY  60 tablet  2  . fluticasone (FLONASE) 50 MCG/ACT nasal spray Place 2 sprays into the nose daily.        . Multiple Vitamin (MULTIVITAMIN) tablet Take 1 tablet by mouth daily.        . nebivolol (BYSTOLIC) 5 MG tablet Take 2.5 mg by mouth daily.        Marland Kitchen omeprazole (PRILOSEC OTC) 20 MG tablet Take 20 mg by mouth daily.        . vitamin E 600 UNIT capsule Take 600 Units by mouth daily.        Marland Kitchen DISCONTD: Bone Meal TABS Take by mouth daily.        Marland Kitchen DISCONTD: sertraline (ZOLOFT) 50 MG tablet Take 50 mg by mouth daily.           Past Medical History  Diagnosis Date  . Bradycardia   . Atrial fibrillation     ROS:   All systems reviewed and negative except as noted in the HPI.   No past surgical history on file.   No family history on file.   History   Social History  . Marital Status: Widowed    Spouse Name: N/A    Number of Children: N/A  . Years of Education: N/A   Occupational History  . Full time [employer]   Social History Main Topics  . Smoking status: Never Smoker   . Smokeless tobacco: Not on file  . Alcohol Use: No  . Drug Use: No  . Sexually Active: Not on file   Other Topics Concern  . Not on file   Social History Narrative   Widowed      BP 116/71  Pulse 55  Ht 5\' 7"  (1.702 m)  Wt 214 lb (97.07 kg)  BMI 33.52 kg/m2  Physical Exam:  Well appearing NAD looks younger than his stated age. HEENT: Unremarkable Neck:  No JVD, no thyromegally Lymphatics:  No adenopathy Back:  No CVA tenderness Lungs:  Clear. Well-healed pacemaker incision. HEART:  Regular rate rhythm, no murmurs, no rubs, no clicks Abd:  Flat, positive bowel sounds, no organomegally, no rebound, no guarding Ext:  2 plus pulses, no edema, no cyanosis, no clubbing Skin:  No rashes no nodules Neuro:  CN II through XII intact, motor grossly intact  EKG Normal sinus rhythm at 55 beats per minute    Assess/Plan:

## 2010-11-03 NOTE — Assessment & Plan Note (Signed)
The patient has given me a list of his heart rate and blood pressure today. In sinus rhythm his heart rates tend to be between 50 and 60 beats per minute. I do not think this degree of bradycardia is enough to explain his symptoms. I specifically do not think a pacemaker would improve his situation currently.

## 2010-11-03 NOTE — Assessment & Plan Note (Signed)
He will continue his current medical therapy.  For the most part his atrial fibrillation appears to be well-controlled.

## 2010-11-03 NOTE — Assessment & Plan Note (Signed)
This appears to be a new problem and is associated with fatigue. He denies anginal symptoms. I wonder if the dyspnea is his anginal equivalent and I have asked him to undergo a regular exercise treadmill test. Additional evaluation will be ongoing with his primary physician.

## 2010-11-20 ENCOUNTER — Encounter: Payer: Self-pay | Admitting: Physician Assistant

## 2010-11-25 ENCOUNTER — Encounter: Payer: PRIVATE HEALTH INSURANCE | Admitting: Physician Assistant

## 2010-12-10 ENCOUNTER — Ambulatory Visit (INDEPENDENT_AMBULATORY_CARE_PROVIDER_SITE_OTHER): Payer: PRIVATE HEALTH INSURANCE | Admitting: Physician Assistant

## 2010-12-10 DIAGNOSIS — R0609 Other forms of dyspnea: Secondary | ICD-10-CM

## 2010-12-10 DIAGNOSIS — I4891 Unspecified atrial fibrillation: Secondary | ICD-10-CM

## 2010-12-10 DIAGNOSIS — R0600 Dyspnea, unspecified: Secondary | ICD-10-CM

## 2010-12-10 NOTE — Progress Notes (Signed)
Exercise Treadmill Test  Pre-Exercise Testing Evaluation Rhythm: sinus bradycardia  Rate: 55   PR:  .20 QRS:  .10  QT:  .46 QTc: .44     Test  Exercise Tolerance Test Ordering MD: Lewayne Bunting, MD  Interpreting MD:  Tereso Newcomer PA-C  Unique Test No: 1  Treadmill:  1  Indication for ETT:  Dyspnea Contraindication to ETT: No   Stress Modality: exercise - treadmill  Cardiac Imaging Performed: non   Protocol: standard Bruce - maximal  Max BP:169/60  Max MPHR (bpm):  143 85% MPR (bpm):  121  MPHR obtained (bpm):  96 % MPHR obtained:    Reached 85% MPHR (min:sec):  Total Exercise Time (min-sec): 5:14  Workload in METS: 7.3 Borg Scale: 15  Reason ETT Terminated:  patient's desire to stop    ST Segment Analysis At Rest: normal ST segments - no evidence of significant ST depression With Exercise: no evidence of significant ST depression  Other Information Arrhythmia:  No Angina during ETT:  absent (0) Quality of ETT:  non-diagnostic  ETT Interpretation:  normal - no evidence of ischemia by ST analysis  Comments: Poor exercise tolerance. No chest pain. Normal BP response to exercise. Patient unable to get heart rate to 85% PMHR. No ST-T changes at submaximal exercise.  Recommendations: Schedule Lexiscan Myoview.

## 2010-12-10 NOTE — Patient Instructions (Signed)
Your physician has requested that you have a lexiscan myoview dx [phone]. For further information please visit [email or web address] Please follow instruction sheet, as given.

## 2010-12-10 NOTE — Progress Notes (Deleted)
Exercise Treadmill Test  Pre-Exercise Testing Evaluation Rhythm: sinus bradycardia  Rate: 55   PR:  .20 QRS:  .10  QT:  .46 QTc: .44     Test  Exercise Tolerance Test Ordering MD: Lewayne Bunting, MD  Interpreting MD:  Tereso Newcomer PA-C  Unique Test No: 1  Treadmill:  1  Indication for ETT: Flecainide  Contraindication to ETT: No   Stress Modality: exercise - treadmill  Cardiac Imaging Performed: non   Protocol: standard Bruce - maximal  Max BP:  ***/***  Max MPHR (bpm):  143 85% MPR (bpm):  121  MPHR obtained (bpm):  *** % MPHR obtained:  ***  Reached 85% MPHR (min:sec):  *** Total Exercise Time (min-sec):  ***  Workload in METS:  *** Borg Scale: ***  Reason ETT Terminated:  {CHL REASON TERMINATED FOR RUE:45409811}    ST Segment Analysis At Rest: {CHL ST SEGMENT AT REST FOR BJY:78295621} With Exercise: {CHL ST SEGMENT WITH EXERCISE FOR HYQ:65784696}  Other Information Arrhythmia:  {CHL ARRHYTHMIA FOR EXB:28413244} Angina during ETT:  {CHL ANGINA DURING WNU:27253664} Quality of ETT:  {CHL QUALITY OF QIH:47425956}  ETT Interpretation:  {CHL INTERPRETATION FOR LOV:56433295}  Comments: ***  Recommendations: ***

## 2010-12-15 ENCOUNTER — Encounter: Payer: Self-pay | Admitting: *Deleted

## 2010-12-17 ENCOUNTER — Ambulatory Visit (HOSPITAL_COMMUNITY): Payer: PRIVATE HEALTH INSURANCE | Attending: Internal Medicine | Admitting: Radiology

## 2010-12-17 VITALS — Ht 67.0 in | Wt 214.0 lb

## 2010-12-17 DIAGNOSIS — I4891 Unspecified atrial fibrillation: Secondary | ICD-10-CM

## 2010-12-17 DIAGNOSIS — R0989 Other specified symptoms and signs involving the circulatory and respiratory systems: Secondary | ICD-10-CM

## 2010-12-17 DIAGNOSIS — R55 Syncope and collapse: Secondary | ICD-10-CM

## 2010-12-17 DIAGNOSIS — R0602 Shortness of breath: Secondary | ICD-10-CM | POA: Insufficient documentation

## 2010-12-17 DIAGNOSIS — R0600 Dyspnea, unspecified: Secondary | ICD-10-CM

## 2010-12-17 MED ORDER — TECHNETIUM TC 99M TETROFOSMIN IV KIT
11.0000 | PACK | Freq: Once | INTRAVENOUS | Status: AC | PRN
  Administered 2010-12-17: 11 via INTRAVENOUS

## 2010-12-17 MED ORDER — TECHNETIUM TC 99M TETROFOSMIN IV KIT
33.0000 | PACK | Freq: Once | INTRAVENOUS | Status: AC | PRN
  Administered 2010-12-17: 33 via INTRAVENOUS

## 2010-12-17 MED ORDER — REGADENOSON 0.4 MG/5ML IV SOLN
0.4000 mg | Freq: Once | INTRAVENOUS | Status: AC
  Administered 2010-12-17: 0.4 mg via INTRAVENOUS

## 2010-12-17 NOTE — Progress Notes (Signed)
[facility] SITE 3 NUCLEAR MED [address] [place] [address] [phone]  Cardiology Nuclear Med Study  William Stein is a 77 y.o. male [id number] 05/29/1930   Nuclear Med Background Indication for Stress Test:  Evaluation for Ischemia History:  '07 Echo:EF=55-65%; '09 Cath:Normal Coronaries, EF=60%; 12/10/10 GXT:Non-Diagnostic d/t inability to reach THR; H/O PAF Cardiac Risk Factors: Hypertension, Lipids and Obesity  Symptoms:  Dizziness, DOE, Fatigue, Near Syncope, Palpitations, Rapid HR and SOB   Nuclear Pre-Procedure Caffeine/Decaff Intake:  None NPO After: 11:00pm   Lungs:  Clear.  O2 Sat 97% on RA. IV 0.9% NS with Angio Cath:  20g  IV Site: R Antecubital  IV Started by:  Bonnita Levan, RN  Chest Size (in):  46 Cup Size: n/a  Height: 5\' 7"  (1.702 m)  Weight:  214 lb (97.07 kg)  BMI:  Body mass index is 33.52 kg/(m^2). Tech Comments:  Bystolic held this a.m.    Nuclear Med Study 1 or 2 day study: 1 day  Stress Test Type:  Treadmill/Lexiscan  Reading MD: Kristeen Miss, MD  Order Authorizing Provider:  Lewayne Bunting, MD; Tereso Newcomer, PA-C  Resting Radionuclide: Technetium 42m Tetrofosmin  Resting Radionuclide Dose: 11.0 mCi   Stress Radionuclide:  Technetium 49m Tetrofosmin  Stress Radionuclide Dose: 33.0 mCi           Stress Protocol Rest HR: 52 Stress HR: 83  Rest BP: 126/73 Stress BP: 174/65  Exercise Time (min): 2:00 METS: n/a   Predicted Max HR: 143 bpm % Max HR: 58.04 bpm Rate Pressure Product: 53664   Dose of Adenosine (mg):  n/a Dose of Lexiscan: 0.4 mg  Dose of Atropine (mg): n/a Dose of Dobutamine: n/a mcg/kg/min (at max HR)  Stress Test Technologist: Smiley Houseman, CMA-N  Nuclear Technologist:  Domenic Polite, CNMT     Rest Procedure:  Myocardial perfusion imaging was performed at rest 45 minutes following the intravenous administration of Technetium 49m Tetrofosmin.  Rest ECG: Sinus bradycardia.  Stress Procedure:   The patient received IV Lexiscan 0.4 mg over 15-seconds with concurrent low level exercise and then Technetium 50m Tetrofosmin was injected at 30-seconds while the patient continued walking one more minute.  There were no significant changes with Lexiscan, other than occasional PVC's.  Quantitative spect images were obtained after a 45-minute delay.  Stress ECG: No significant change from baseline ECG  QPS Raw Data Images:  Normal; no motion artifact; normal heart/lung ratio. Stress Images:  There is apical thinning and normal uptake in the other regions of the LV. Rest Images:  There is apical thinning and normal uptake in the other regions of the LV. Subtraction (SDS):  No evidence of ischemia. Transient Ischemic Dilatation (Normal <1.22):  1.07 Lung/Heart Ratio (Normal <0.45):  0.30  Quantitative Gated Spect Images QGS EDV:  107 ml QGS ESV:  34 ml QGS cine images:  NL LV Function; NL Wall Motion.  The apex contracts normally. QGS EF: 68%  Impression Exercise Capacity:  Lexiscan with no exercise. BP Response:  Normal blood pressure response. Clinical Symptoms:  No chest pain. ECG Impression:  No significant ST segment change suggestive of ischemia. Comparison with Prior Nuclear Study: No images to compare  Overall Impression:  Normal stress nuclear study.  No evidence of ischemia.  Normal LV function.  The apex contracts normally which suggests that the apical thinning is due to artifact and not a previous MI.

## 2010-12-22 ENCOUNTER — Telehealth: Payer: Self-pay | Admitting: Internal Medicine

## 2010-12-22 NOTE — Telephone Encounter (Signed)
LMOM for pt with results

## 2010-12-22 NOTE — Telephone Encounter (Signed)
Per pt call, pt had MYOVIEW on August 19th and has yet to hear back the results of that test. Please return pt call to advise/discuss.

## 2010-12-24 ENCOUNTER — Telehealth: Payer: Self-pay | Admitting: *Deleted

## 2010-12-24 NOTE — Telephone Encounter (Signed)
pt already aware of myoview results today. William Stein

## 2011-01-25 LAB — CBC
HCT: 37.5 — ABNORMAL LOW
HCT: 37.6 — ABNORMAL LOW
Hemoglobin: 13.1
Hemoglobin: 13.7
Hemoglobin: 14.4
Hemoglobin: 14.6
Hemoglobin: 14.8
Hemoglobin: 15.3
MCHC: 35
MCHC: 35.4
MCHC: 35.6
MCHC: 34.9
MCHC: 35.2
MCHC: 35.3
MCHC: 35.4
MCV: 93.6
Platelets: 272
Platelets: 221
Platelets: 226
Platelets: 230
Platelets: 246
RBC: 4.02 — ABNORMAL LOW
RBC: 4.14 — ABNORMAL LOW
RBC: 4.64
RDW: 13.4
RDW: 13.5
RDW: 13.7
RDW: 13.7
RDW: 13.8
WBC: 6.6
WBC: 9

## 2011-01-25 LAB — PROTIME-INR
INR: 1
INR: 1
INR: 1.3
INR: 1.9 — ABNORMAL HIGH
INR: 1.9 — ABNORMAL HIGH
INR: 2.3 — ABNORMAL HIGH
Prothrombin Time: 13.7
Prothrombin Time: 15.2
Prothrombin Time: 15.7 — ABNORMAL HIGH
Prothrombin Time: 16.4 — ABNORMAL HIGH
Prothrombin Time: 22.3 — ABNORMAL HIGH
Prothrombin Time: 22.4 — ABNORMAL HIGH
Prothrombin Time: 26.2 — ABNORMAL HIGH

## 2011-01-25 LAB — BASIC METABOLIC PANEL
BUN: 13
CO2: 25
CO2: 27
CO2: 28
CO2: 25
Calcium: 8.5
Calcium: 8.5
Calcium: 9.2
Chloride: 106
Chloride: 107
Chloride: 107
Creatinine, Ser: 1.1
Creatinine, Ser: 1.12
Creatinine, Ser: 1.05
GFR calc Af Amer: >60
GFR calc Af Amer: >60
GFR calc Af Amer: >60
GFR calc non Af Amer: >60
GFR calc non Af Amer: >60
Glucose, Bld: 103 — ABNORMAL HIGH
Glucose, Bld: 107 — ABNORMAL HIGH
Glucose, Bld: 107 — ABNORMAL HIGH
Potassium: 3.7
Potassium: 3.9
Sodium: 136
Sodium: 139
Sodium: 139

## 2011-01-25 LAB — HEPARIN LEVEL (UNFRACTIONATED)
Heparin Unfractionated: 0.45
Heparin Unfractionated: 0.56
Heparin Unfractionated: 1.18 — ABNORMAL HIGH
Heparin Unfractionated: 0.38
Heparin Unfractionated: 0.41
Heparin Unfractionated: 0.44

## 2011-01-25 LAB — CARDIAC PANEL(CRET KIN+CKTOT+MB+TROPI)
Relative Index: INVALID
Relative Index: INVALID
Total CK: 78
Troponin I: 0.04

## 2011-01-25 LAB — LIPID PANEL
Cholesterol: 123
LDL Cholesterol: 84
VLDL: 18

## 2011-01-25 LAB — COMPREHENSIVE METABOLIC PANEL
Alkaline Phosphatase: 39
CO2: 26
Creatinine, Ser: 1.3
GFR calc Af Amer: >60
Sodium: 141
Total Bilirubin: 1.3 — ABNORMAL HIGH
Total Protein: 6.4

## 2011-01-25 LAB — CK TOTAL AND CKMB (NOT AT ARMC)
CK, MB: 1.9
Relative Index: 1.9
Total CK: 101

## 2011-01-25 LAB — DIFFERENTIAL
Basophils Absolute: 0
Basophils Relative: 0
Neutro Abs: 4.4
Neutrophils Relative %: 49

## 2011-02-04 ENCOUNTER — Other Ambulatory Visit: Payer: Self-pay | Admitting: Internal Medicine

## 2011-02-05 ENCOUNTER — Other Ambulatory Visit: Payer: Self-pay

## 2011-02-05 MED ORDER — FLECAINIDE ACETATE 100 MG PO TABS: 100.0000 mg | ORAL_TABLET | Freq: Two times a day (BID) | ORAL | Status: DC

## 2011-03-04 ENCOUNTER — Ambulatory Visit: Payer: PRIVATE HEALTH INSURANCE | Admitting: Internal Medicine

## 2011-03-11 ENCOUNTER — Ambulatory Visit (INDEPENDENT_AMBULATORY_CARE_PROVIDER_SITE_OTHER): Payer: PRIVATE HEALTH INSURANCE | Admitting: Physician Assistant

## 2011-03-11 ENCOUNTER — Encounter: Payer: Self-pay | Admitting: Physician Assistant

## 2011-03-11 ENCOUNTER — Encounter (INDEPENDENT_AMBULATORY_CARE_PROVIDER_SITE_OTHER): Payer: PRIVATE HEALTH INSURANCE

## 2011-03-11 DIAGNOSIS — I498 Other specified cardiac arrhythmias: Secondary | ICD-10-CM

## 2011-03-11 DIAGNOSIS — I4891 Unspecified atrial fibrillation: Secondary | ICD-10-CM

## 2011-03-11 DIAGNOSIS — R0989 Other specified symptoms and signs involving the circulatory and respiratory systems: Secondary | ICD-10-CM

## 2011-03-11 DIAGNOSIS — R0600 Dyspnea, unspecified: Secondary | ICD-10-CM

## 2011-03-11 NOTE — Assessment & Plan Note (Addendum)
He is getting more short of breath and weak with more extreme activities.  This seems to be worse over the last 2 years.  His recent Myoview was ok without ischemia or scar and normal LVF.  But, we had a hard time getting to his target HR with the ETT.  He has had a recent episode that felt like he was back in AFib.  Question if he is having more parox AFib.  Will obtain a 21 day event monitor to assess for significant amounts of AFib.   Discussed with Dr. Ladona Ridgel.  Will also stop his Bystolic.  Follow up with Dr. Ladona Ridgel in 2 mos.

## 2011-03-11 NOTE — Patient Instructions (Addendum)
Your physician has recommended that you wear an event monitor DX 427.31 AFIB. Event monitors are medical devices that record the heart's electrical activity. Doctors most often [place] these monitors to diagnose arrhythmias. Arrhythmias are problems with the speed or rhythm of the heartbeat. The monitor is a small, portable device. You can wear one while you do your normal daily activities. This is usually used to diagnose what is causing palpitations/syncope (passing out).  Your physician has recommended you make the following change in your medication: STOP TAKING BYSTOLIC  Your physician recommends that you schedule a follow-up appointment in: 2 MONTHS WITH DR. Ladona Ridgel AS PER DR. Ladona Ridgel AND [address] PA-C

## 2011-03-11 NOTE — Assessment & Plan Note (Signed)
Continue Flecainide and Pradaxa.  Obtain monitor as noted and follow up with Dr Ladona Ridgel in 2 mos.

## 2011-03-11 NOTE — Progress Notes (Signed)
History of Present Illness: PCP:  Dr. Ricki Miller Primary Cardiologist:  Dr. Lewayne Bunting   William Stein is a 77 y.o. male with a h/o persistent AFib, maintaining NSR on Flecainide, sinus bradycardia, HTN and HLP.  LHC 3/09: mild ectasia of pLAD and pRCA, EF 60%, renals, iliacs, abdominal aorta all normal.  Echo 2/07: EF 55-65%, diastolic dysfunction.  He was last seen by Dr. Ladona Ridgel in 7/12.  He c/o increased fatigue, weakness and dyspnea.  An ETT was arranged and performed by me in 8/12.  He had poor exercise tolerance and did not achieve his target HR.  A Lexiscan Myoview demonstrated apical thinning, no ischemia and EF 68%.  He presents for annual follow up.  Missed his recent appointment with Dr. Ladona Ridgel and rescheduled with me today.  Notes continued fatigue and dyspnea with more extreme activities.  Works 2 jobs.  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  Really describes Class 2b-3 dyspnea at times.  No orthopnea, PND or edema.  No chest pain.  No syncope.  Did have an episode 2 weeks ago where he felt like he was in AFib.  Brings in a list of his BPs and heart rates.  Felt very weak during this episode and recorded one BP of 61/48 and a HR as high as 98 (which is high for him).  This is the first episode he has had in a long time.    Past Medical History  Diagnosis Date  . Bradycardia   . Atrial fibrillation   . HTN (hypertension)   . HLD (hyperlipidemia)   . GERD (gastroesophageal reflux disease)     Current Outpatient Prescriptions  Medication Sig Dispense Refill  . dabigatran (PRADAXA) 150 MG CAPS Take 150 mg by mouth 2 (two) times daily.        . finasteride (PROSCAR) 5 MG tablet Take 5 mg by mouth daily.        . flecainide (TAMBOCOR) 100 MG tablet Take 1 tablet (100 mg total) by mouth 2 (two) times daily.  60 tablet  2  . Multiple Vitamin (MULTIVITAMIN) tablet Take 1 tablet by mouth daily.        . nebivolol (BYSTOLIC) 5 MG tablet Take 2.5 mg by mouth daily.        Marland Kitchen omeprazole  (PRILOSEC OTC) 20 MG tablet Take 20 mg by mouth daily.        . vitamin E 600 UNIT capsule Take 600 Units by mouth daily.        . fluticasone (FLONASE) 50 MCG/ACT nasal spray Place 2 sprays into the nose daily.          Allergies: No Known Allergies  History  Substance Use Topics  . Smoking status: Never Smoker   . Smokeless tobacco: Not on file  . Alcohol Use: No     ROS:  Please see the history of present illness.   Respiratory ROS: positive for - cough.  All other systems reviewed and negative.   Vital Signs: BP 136/64  Pulse 56  Ht 5\' 8"  (1.727 m)  Wt 214 lb (97.07 kg)  BMI 32.54 kg/m2  PHYSICAL EXAM: Well nourished, well developed, in no acute distress HEENT: normal Neck: no JVD Cardiac:  normal S1, S2; RRR; no murmur Lungs:  clear to auscultation bilaterally, no wheezing, rhonchi or rales Abd: soft, nontender, no hepatomegaly Ext: no edema Skin: warm and dry Neuro:  CNs 2-12 intact, no focal abnormalities noted Psych:  Normal affect  EKG:  Sinus brady, HR 52, normal axis, NSSTTW changes,   ASSESSMENT AND PLAN:

## 2011-03-11 NOTE — Assessment & Plan Note (Signed)
Likely has chronotropic incompetence.  Stop bystolic and obtain monitor as noted.

## 2011-05-10 DIAGNOSIS — J1189 Influenza due to unidentified influenza virus with other manifestations: Secondary | ICD-10-CM | POA: Diagnosis not present

## 2011-05-10 DIAGNOSIS — I4891 Unspecified atrial fibrillation: Secondary | ICD-10-CM | POA: Diagnosis not present

## 2011-05-17 ENCOUNTER — Other Ambulatory Visit: Payer: Self-pay

## 2011-05-17 ENCOUNTER — Other Ambulatory Visit: Payer: Self-pay | Admitting: Internal Medicine

## 2011-05-17 MED ORDER — FLECAINIDE ACETATE 100 MG PO TABS: 100.0000 mg | ORAL_TABLET | Freq: Two times a day (BID) | ORAL | Status: DC

## 2011-05-25 ENCOUNTER — Encounter: Payer: Self-pay | Admitting: Internal Medicine

## 2011-05-27 DIAGNOSIS — J069 Acute upper respiratory infection, unspecified: Secondary | ICD-10-CM | POA: Diagnosis not present

## 2011-05-28 ENCOUNTER — Encounter: Payer: Self-pay | Admitting: Internal Medicine

## 2011-05-28 ENCOUNTER — Ambulatory Visit (INDEPENDENT_AMBULATORY_CARE_PROVIDER_SITE_OTHER): Payer: PRIVATE HEALTH INSURANCE | Admitting: Internal Medicine

## 2011-05-28 VITALS — BP 136/54 | HR 69 | Ht 67.0 in | Wt 205.0 lb

## 2011-05-28 DIAGNOSIS — I4891 Unspecified atrial fibrillation: Secondary | ICD-10-CM

## 2011-05-28 DIAGNOSIS — I10 Essential (primary) hypertension: Secondary | ICD-10-CM

## 2011-05-28 NOTE — Assessment & Plan Note (Signed)
He is maintaining NSR on flecainide. He will continue his current meds.  

## 2011-05-28 NOTE — Progress Notes (Signed)
HPI William Stein returns today for followup. He has a h/o PAF, HTN, and sinus bradycardia. He continues to work several jobs. He has very few palpitations. He has had no sustained arrhythmias. No Known Allergies   Current Outpatient Prescriptions  Medication Sig Dispense Refill  . cholecalciferol (VITAMIN D) 1000 UNITS tablet Take 1,000 Units by mouth daily.      . dabigatran (PRADAXA) 150 MG CAPS Take 150 mg by mouth 2 (two) times daily.        Marland Kitchen diltiazem (DILACOR XR) 180 MG 24 hr capsule Take 180 mg by mouth daily.      . finasteride (PROSCAR) 5 MG tablet Take 5 mg by mouth daily.        . flecainide (TAMBOCOR) 100 MG tablet Take 1 tablet (100 mg total) by mouth 2 (two) times daily.  60 tablet  1  . fluticasone (FLONASE) 50 MCG/ACT nasal spray Place 2 sprays into the nose daily.        . Multiple Vitamin (MULTIVITAMIN) tablet Take 1 tablet by mouth daily.        Marland Kitchen omeprazole (PRILOSEC OTC) 20 MG tablet Take 20 mg by mouth daily.        . vitamin E 600 UNIT capsule Take 600 Units by mouth daily.           Past Medical History  Diagnosis Date  . Bradycardia   . Atrial fibrillation   . HTN (hypertension)   . HLD (hyperlipidemia)   . GERD (gastroesophageal reflux disease)     ROS:   All systems reviewed and negative except as noted in the HPI.   Past Surgical History  Procedure Date  . Doppler echocardiography 2011  . Exercise stress test 2012  . Cardiac catheterization 2009  . Colonoscopy 2003     Family History  Problem Relation Age of Onset  . Dementia Father 17  . Other Mother 68    old age  . Coronary artery disease Neg Hx      History   Social History  . Marital Status: Widowed    Spouse Name: N/A    Number of Children: N/A  . Years of Education: N/A   Occupational History  . Full time [employer]   Social History Main Topics  . Smoking status: Never Smoker   . Smokeless tobacco: Not on file  . Alcohol Use: No  . Drug  Use: No  . Sexually Active: Not on file   Other Topics Concern  . Not on file   Social History Narrative   Widowed      BP 136/54  Pulse 69  Ht 5\' 7"  (1.702 m)  Wt 92.987 kg (205 lb)  BMI 32.11 kg/m2  Physical Exam:  Well appearing 78 yo man, NAD HEENT: Unremarkable Neck:  No JVD, no thyromegally Lungs:  Clear with no wheezes, rales, or rhonchi. HEART:  Regular rate rhythm, no murmurs, no rubs, no clicks Abd:  soft, positive bowel sounds, no organomegally, no rebound, no guarding Ext:  2 plus pulses, no edema, no cyanosis, no clubbing Skin:  No rashes no nodules Neuro:  CN II through XII intact, motor grossly intact  EKG NSR  Assess/Plan:

## 2011-05-28 NOTE — Assessment & Plan Note (Signed)
His blood pressure is well controlled. He is instructed to maintain a low sodium diet. 

## 2011-05-28 NOTE — Patient Instructions (Signed)
Your physician wants you to follow-up in: 12 months with Dr. Taylor. You will receive a reminder letter in the mail two months in advance. If you don't receive a letter, please call our office to schedule the follow-up appointment.    

## 2011-07-06 DIAGNOSIS — N63 Unspecified lump in unspecified breast: Secondary | ICD-10-CM | POA: Diagnosis not present

## 2011-07-06 DIAGNOSIS — M62838 Other muscle spasm: Secondary | ICD-10-CM | POA: Diagnosis not present

## 2011-07-08 DIAGNOSIS — N63 Unspecified lump in unspecified breast: Secondary | ICD-10-CM | POA: Diagnosis not present

## 2011-07-09 DIAGNOSIS — M545 Low back pain: Secondary | ICD-10-CM | POA: Diagnosis not present

## 2011-07-26 ENCOUNTER — Other Ambulatory Visit: Payer: Self-pay

## 2011-07-26 ENCOUNTER — Other Ambulatory Visit: Payer: Self-pay | Admitting: Internal Medicine

## 2011-07-26 MED ORDER — FLECAINIDE ACETATE 100 MG PO TABS: 100.0000 mg | ORAL_TABLET | Freq: Two times a day (BID) | ORAL | Status: DC

## 2011-10-20 DIAGNOSIS — J209 Acute bronchitis, unspecified: Secondary | ICD-10-CM | POA: Diagnosis not present

## 2011-10-20 DIAGNOSIS — R062 Wheezing: Secondary | ICD-10-CM | POA: Diagnosis not present

## 2011-10-20 DIAGNOSIS — R05 Cough: Secondary | ICD-10-CM | POA: Diagnosis not present

## 2011-11-06 ENCOUNTER — Inpatient Hospital Stay (HOSPITAL_COMMUNITY)
Admission: EM | Admit: 2011-11-06 | Discharge: 2011-11-07 | DRG: 310 | Disposition: A | Discharge status: Home / Self Care | Payer: PRIVATE HEALTH INSURANCE | Attending: Internal Medicine | Admitting: Internal Medicine

## 2011-11-06 ENCOUNTER — Emergency Department (HOSPITAL_COMMUNITY): Payer: PRIVATE HEALTH INSURANCE

## 2011-11-06 ENCOUNTER — Encounter (HOSPITAL_COMMUNITY): Payer: Self-pay | Admitting: Emergency Medicine

## 2011-11-06 DIAGNOSIS — Z91199 Patient's noncompliance with other medical treatment and regimen due to unspecified reason: Secondary | ICD-10-CM

## 2011-11-06 DIAGNOSIS — R002 Palpitations: Secondary | ICD-10-CM | POA: Diagnosis not present

## 2011-11-06 DIAGNOSIS — J40 Bronchitis, not specified as acute or chronic: Secondary | ICD-10-CM | POA: Diagnosis not present

## 2011-11-06 DIAGNOSIS — Z9119 Patient's noncompliance with other medical treatment and regimen: Secondary | ICD-10-CM

## 2011-11-06 DIAGNOSIS — Z79899 Other long term (current) drug therapy: Secondary | ICD-10-CM | POA: Diagnosis not present

## 2011-11-06 DIAGNOSIS — I4891 Unspecified atrial fibrillation: Secondary | ICD-10-CM | POA: Diagnosis not present

## 2011-11-06 DIAGNOSIS — E785 Hyperlipidemia, unspecified: Secondary | ICD-10-CM | POA: Diagnosis present

## 2011-11-06 DIAGNOSIS — I10 Essential (primary) hypertension: Secondary | ICD-10-CM | POA: Diagnosis present

## 2011-11-06 DIAGNOSIS — Z7901 Long term (current) use of anticoagulants: Secondary | ICD-10-CM | POA: Diagnosis not present

## 2011-11-06 DIAGNOSIS — K219 Gastro-esophageal reflux disease without esophagitis: Secondary | ICD-10-CM | POA: Diagnosis present

## 2011-11-06 DIAGNOSIS — R42 Dizziness and giddiness: Secondary | ICD-10-CM | POA: Diagnosis not present

## 2011-11-06 HISTORY — DX: Patient's noncompliance with other medical treatment and regimen due to unspecified reason: Z91.199

## 2011-11-06 HISTORY — DX: Patient's noncompliance with other medical treatment and regimen: Z91.19

## 2011-11-06 LAB — CBC WITH DIFFERENTIAL/PLATELET
Basophils Absolute: 0 10*3/uL (ref 0.0–0.1)
Basophils Relative: 0 % (ref 0–1)
Eosinophils Absolute: 0.2 10*3/uL (ref 0.0–0.7)
Eosinophils Relative: 3 % (ref 0–5)
HCT: 40.8 % (ref 39.0–52.0)
Hemoglobin: 14.2 g/dL (ref 13.0–17.0)
Lymphocytes Relative: 52 % — ABNORMAL HIGH (ref 12–46)
Lymphs Abs: 3.6 10*3/uL (ref 0.7–4.0)
MCH: 32.1 pg (ref 26.0–34.0)
MCHC: 34.8 g/dL (ref 30.0–36.0)
MCV: 92.3 fL (ref 78.0–100.0)
Monocytes Absolute: 0.6 10*3/uL (ref 0.1–1.0)
Monocytes Relative: 8 % (ref 3–12)
Neutro Abs: 2.6 10*3/uL (ref 1.7–7.7)
Neutrophils Relative %: 38 % — ABNORMAL LOW (ref 43–77)
Platelets: 241 10*3/uL (ref 150–400)
RBC: 4.42 MIL/uL (ref 4.22–5.81)
RDW: 13.1 % (ref 11.5–15.5)
WBC: 7 10*3/uL (ref 4.0–10.5)

## 2011-11-06 LAB — BASIC METABOLIC PANEL
CO2: 25 mEq/L (ref 19–32)
Calcium: 9.3 mg/dL (ref 8.4–10.5)
Creatinine, Ser: 1.32 mg/dL (ref 0.50–1.35)
Glucose, Bld: 97 mg/dL (ref 70–99)

## 2011-11-06 MED ORDER — ASPIRIN EC 81 MG PO TBEC
81.0000 mg | DELAYED_RELEASE_TABLET | Freq: Every day | ORAL | Status: DC
  Administered 2011-11-07: 81 mg via ORAL
  Filled 2011-11-06: qty 1

## 2011-11-06 MED ORDER — PANTOPRAZOLE SODIUM 40 MG PO TBEC
40.0000 mg | DELAYED_RELEASE_TABLET | Freq: Every day | ORAL | Status: DC
  Administered 2011-11-06 – 2011-11-07 (×2): 40 mg via ORAL
  Filled 2011-11-06 (×2): qty 1

## 2011-11-06 MED ORDER — NITROGLYCERIN 0.4 MG SL SUBL: 0.4000 mg | SUBLINGUAL_TABLET | SUBLINGUAL | Status: DC | PRN

## 2011-11-06 MED ORDER — DILTIAZEM HCL 100 MG IV SOLR
5.0000 mg/h | INTRAVENOUS | Status: DC
  Filled 2011-11-06: qty 100

## 2011-11-06 MED ORDER — ASPIRIN 81 MG PO CHEW
324.0000 mg | CHEWABLE_TABLET | ORAL | Status: AC
  Administered 2011-11-06: 324 mg via ORAL
  Filled 2011-11-06: qty 4

## 2011-11-06 MED ORDER — ACETAMINOPHEN 325 MG PO TABS: 650.0000 mg | ORAL_TABLET | ORAL | Status: DC | PRN

## 2011-11-06 MED ORDER — DILTIAZEM HCL ER COATED BEADS 240 MG PO CP24
240.0000 mg | ORAL_CAPSULE | Freq: Every day | ORAL | Status: DC
  Administered 2011-11-07: 240 mg via ORAL
  Filled 2011-11-06: qty 1

## 2011-11-06 MED ORDER — DABIGATRAN ETEXILATE MESYLATE 150 MG PO CAPS
150.0000 mg | ORAL_CAPSULE | Freq: Two times a day (BID) | ORAL | Status: DC
  Administered 2011-11-06 – 2011-11-07 (×2): 150 mg via ORAL
  Filled 2011-11-06 (×3): qty 1

## 2011-11-06 MED ORDER — FLECAINIDE ACETATE 100 MG PO TABS
100.0000 mg | ORAL_TABLET | Freq: Two times a day (BID) | ORAL | Status: DC
  Administered 2011-11-06 – 2011-11-07 (×2): 100 mg via ORAL
  Filled 2011-11-06 (×3): qty 1

## 2011-11-06 MED ORDER — VITAMIN E 180 MG (400 UNIT) PO CAPS
400.0000 [IU] | ORAL_CAPSULE | Freq: Every day | ORAL | Status: DC
  Administered 2011-11-07: 400 [IU] via ORAL
  Filled 2011-11-06: qty 1

## 2011-11-06 MED ORDER — TRAMADOL HCL 50 MG PO TABS: 50.0000 mg | ORAL_TABLET | Freq: Two times a day (BID) | ORAL | Status: DC | PRN

## 2011-11-06 MED ORDER — ONDANSETRON HCL 4 MG/2ML IJ SOLN: 4.0000 mg | Freq: Four times a day (QID) | INTRAMUSCULAR | Status: DC | PRN

## 2011-11-06 MED ORDER — ASPIRIN 300 MG RE SUPP: 300.0000 mg | RECTAL | Status: AC

## 2011-11-06 MED ORDER — DILTIAZEM HCL 100 MG IV SOLR
5.0000 mg/h | Freq: Once | INTRAVENOUS | Status: AC
  Administered 2011-11-06: 10 mg/h via INTRAVENOUS
  Filled 2011-11-06: qty 100

## 2011-11-06 MED ORDER — CLONAZEPAM 0.5 MG PO TABS: 0.2500 mg | ORAL_TABLET | Freq: Three times a day (TID) | ORAL | Status: DC | PRN

## 2011-11-06 MED ORDER — OFF THE BEAT BOOK
Freq: Once | Status: AC
Start: 2011-11-06 — End: 2011-11-06
  Administered 2011-11-06: 23:00:00
  Filled 2011-11-06: qty 1

## 2011-11-06 MED ORDER — SODIUM CHLORIDE 0.9 % IV BOLUS (SEPSIS)
1000.0000 mL | Freq: Once | INTRAVENOUS | Status: AC
  Administered 2011-11-06: 1000 mL via INTRAVENOUS

## 2011-11-06 MED ORDER — VITAMIN D3 25 MCG (1000 UNIT) PO TABS
1000.0000 [IU] | ORAL_TABLET | Freq: Every day | ORAL | Status: DC
  Administered 2011-11-07: 1000 [IU] via ORAL
  Filled 2011-11-06: qty 1

## 2011-11-06 MED ORDER — DILTIAZEM HCL 25 MG/5ML IV SOLN
15.0000 mg | Freq: Once | INTRAVENOUS | Status: AC
  Administered 2011-11-06: 15 mg via INTRAVENOUS
  Filled 2011-11-06: qty 5

## 2011-11-06 NOTE — ED Provider Notes (Signed)
History     CSN: [id number]  Arrival date & time 11/06/11  1355   First MD Initiated Contact with Patient 11/06/11 1528      Chief Complaint  Patient presents with  . Atrial Fibrillation     The history is provided by the patient.   the patient has a history of paroxysmal atrial fibrillation.  He reports for the last week he's been pretty persistent atrial fibrillation.  He is on flecainide and Cardizem.  He was him by systolic several months ago but he was taken off of this.  He reports no recent changes in his diet or exercise.  He denies chest pain or shortness of breath.  The reason he presents today is because he became lightheaded this morning and record his blood pressures in the 70s and 80s.  His had no nausea vomiting or diarrhea.  He denies melena and hematochezia.  He has no change in his oral intake.  Besides feeling his palpitations and an abnormal pulse he has no other complaints at this time.  He follows with with [facility] cardiology  Past Medical History  Diagnosis Date  . Bradycardia   . Atrial fibrillation   . HTN (hypertension)   . HLD (hyperlipidemia)   . GERD (gastroesophageal reflux disease)     Past Surgical History  Procedure Date  . Doppler echocardiography 2011  . Exercise stress test 2012  . Colonoscopy 2003  . Cardiac catheterization 2009    Family History  Problem Relation Age of Onset  . Dementia Father 13  . Other Mother 96    old age  . Coronary artery disease Neg Hx     History  Substance Use Topics  . Smoking status: Never Smoker   . Smokeless tobacco: Not on file  . Alcohol Use: No      Review of Systems  All other systems reviewed and are negative.    Allergies  Review of patient's allergies indicates no known allergies.  Home Medications   Current Outpatient Rx  Name Route Sig Dispense Refill  . VITAMIN D3 1000 UNITS PO TABS Oral Take 1,000 Units by mouth daily.    Marland Kitchen CLONAZEPAM 0.5 MG PO TABS Oral Take 0.5 mg by mouth  2 (two) times daily as needed. For anxiety    . DABIGATRAN ETEXILATE MESYLATE 150 MG PO CAPS Oral Take 150 mg by mouth 2 (two) times daily.      Marland Kitchen DILTIAZEM HCL ER 180 MG PO CP24 Oral Take 180 mg by mouth daily.    Marland Kitchen FLECAINIDE ACETATE 100 MG PO TABS Oral Take 1 tablet (100 mg total) by mouth 2 (two) times daily. 60 tablet 10  . ONE-DAILY MULTI VITAMINS PO TABS Oral Take 1 tablet by mouth daily.      Marland Kitchen OMEPRAZOLE MAGNESIUM 20 MG PO TBEC Oral Take 20 mg by mouth daily.      Marland Kitchen SALINE NASAL SPRAY 0.65 % NA SOLN Nasal Place 2 sprays into the nose 2 (two) times daily.    . TRAMADOL HCL 50 MG PO TABS Oral Take 50 mg by mouth 2 (two) times daily as needed. For pain    . VITAMIN E 400 UNITS PO CAPS Oral Take 400 Units by mouth daily.      BP 108/68  Pulse 87  Temp 98.3 F (36.8 C) (Oral)  Resp 18  SpO2 100%  Physical Exam  Nursing note and vitals reviewed. Constitutional: He is oriented to person, place, and time. He  appears well-developed and well-nourished.  HENT:  Head: Normocephalic and atraumatic.  Eyes: EOM are normal.  Neck: Normal range of motion.  Cardiovascular: Regular rhythm, normal heart sounds and intact distal pulses.        Irregularly irregular.  Tachycardic  Pulmonary/Chest: Effort normal and breath sounds normal. No respiratory distress.  Abdominal: Soft. He exhibits no distension. There is no tenderness.  Musculoskeletal: Normal range of motion.  Neurological: He is alert and oriented to person, place, and time.  Skin: Skin is warm and dry.  Psychiatric: He has a normal mood and affect. Judgment normal.    ED Course  Procedures (including critical care time)  Date: 11/06/2011  Rate: 123  Rhythm: Atrial fibrillation with rapid ventricular response  QRS Axis: normal  Intervals: normal  ST/T Wave abnormalities: normal  Conduction Disutrbances: none  Narrative Interpretation:   Old EKG Reviewed: changed from prior ecg     Labs Reviewed  CBC WITH DIFFERENTIAL  - Abnormal; Notable for the following:    Neutrophils Relative 38 (*)     Lymphocytes Relative 52 (*)     All other components within normal limits  BASIC METABOLIC PANEL - Abnormal; Notable for the following:    GFR calc non Af Amer 50 (*)     GFR calc Af Amer 58 (*)     All other components within normal limits  TROPONIN I   Dg Chest 2 View  11/06/2011  *RADIOLOGY REPORT*  Clinical Data: Atrial fibrillation.  History bronchitis.  CHEST - 2 VIEW  Comparison: 07/08/2010.  Findings: Cardiopericardial silhouette appears within normal limits.  No airspace disease.  No effusion.  Unchanged thoracic kyphosis with lower thoracic compression fractures. Monitoring leads are projected over the chest.  IMPRESSION: No active cardiopulmonary disease or interval change compared to prior.  Original Report Authenticated By: Andreas Newport, M.D.    I personally reviewed the imaging tests through PACS system  I reviewed available ER/hospitalization records thought the EMR     1. afib with RVR   MDM  The patient presents with atrial fibrillation with rapid ventricular response.  The patient be started on a Cardizem drip.  His labs are otherwise normal.  Chest x-ray demonstrates no acute pathology.  The patient will require admission to the cardiology service  5:26 PM Will call cardiology. Drip at 10. HR 106, still afib        Lyanne Co, MD 11/06/11 1727

## 2011-11-06 NOTE — ED Notes (Signed)
MD at bedside.  Admitting \md at bedside

## 2011-11-06 NOTE — H&P (Signed)
William Stein is an 78 y.o. male.    Primary Cardiologist: Dr. Lewayne Bunting  Chief Complaint: Palpitation and dizziness    HPI: 78 y/o male with a PMH of PAF presenting with 10 days history of intermittent palpitation and dizziness.  His AFIB has been rate controlled with Flecainide and Cardizem 180 mg q daily, and he has been on Pradaxa for anticoagulation.  He admits to medication non-compliance as he frequently forgets to take his medications sometimes for several days. 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  He presented today with AFIB with RVR with heart rate 123 bpm by ECG.  He states that he felt dizzy at home prior to presentation and that his blood pressure was 73/56 mmHg at home prior to presenting to the ED.  In ED, he was started on Cardizem drip and his heart rate is in the 100 to 110 bpm range at this time. He denies any chest pain, shortness of breath, PND, orthopnea or syncope.   Prior TTE 06/17/2005: mild LVH with LVEF 55-60%  Myoview 12/17/2010: No evidence of ischemia  LHC 07/2007: mild ectasia of pLAD and RCA, LVEF 60%,   Past Medical History  Diagnosis Date  . Bradycardia   . Atrial fibrillation   . HTN (hypertension)   . HLD (hyperlipidemia)   . GERD (gastroesophageal reflux disease)     Past Surgical History  Procedure Date  . Doppler echocardiography 2011  . Exercise stress test 2012  . Colonoscopy 2003  . Cardiac catheterization 2009    Family History  Problem Relation Age of Onset  . Dementia Father 35  . Other Mother 35    old age  . Coronary artery disease Neg Hx    Social History:  reports that he has never smoked. He does not have any smokeless tobacco history on file. He reports that he does not drink alcohol or use illicit drugs.  Allergies: No Known Allergies   (Not in a hospital admission)  Results for orders placed during the hospital encounter of 11/06/11 (from the past  48 hour(s))  CBC WITH DIFFERENTIAL     Status: Abnormal   Collection Time   11/06/11  2:06 PM      Component Value Range Comment   WBC 7.0  4.0 - 10.5 K/uL    RBC 4.42  4.22 - 5.81 MIL/uL    Hemoglobin 14.2  13.0 - 17.0 g/dL    HCT 16.1  09.6 - 04.5 %    MCV 92.3  78.0 - 100.0 fL    MCH 32.1  26.0 - 34.0 pg    MCHC 34.8  30.0 - 36.0 g/dL    RDW 40.9  81.1 - 91.4 %    Platelets 241  150 - 400 K/uL    Neutrophils Relative 38 (*) 43 - 77 %    Neutro Abs 2.6  1.7 - 7.7 K/uL    Lymphocytes Relative 52 (*) 12 - 46 %    Lymphs Abs 3.6  0.7 - 4.0 K/uL    Monocytes Relative 8  3 - 12 %    Monocytes Absolute 0.6  0.1 - 1.0 K/uL    Eosinophils Relative 3  0 - 5 %    Eosinophils Absolute 0.2  0.0 - 0.7 K/uL    Basophils Relative 0  0 - 1 %    Basophils Absolute 0.0  0.0 - 0.1 K/uL  BASIC METABOLIC PANEL     Status: Abnormal   Collection Time   11/06/11  2:06 PM      Component Value Range Comment   Sodium 136  135 - 145 mEq/L    Potassium 4.1  3.5 - 5.1 mEq/L    Chloride 100  96 - 112 mEq/L    CO2 25  19 - 32 mEq/L    Glucose, Bld 97  70 - 99 mg/dL    BUN 18  6 - 23 mg/dL    Creatinine, Ser 1.47  0.50 - 1.35 mg/dL    Calcium 9.3  8.4 - 82.9 mg/dL    GFR calc non Af Amer 50 (*) >90 mL/min    GFR calc Af Amer 58 (*) >90 mL/min   TROPONIN I     Status: Normal   Collection Time   11/06/11  3:42 PM      Component Value Range Comment   Troponin I <0.30  <0.30 ng/mL    Dg Chest 2 View  11/06/2011  *RADIOLOGY REPORT*  Clinical Data: Atrial fibrillation.  History bronchitis.  CHEST - 2 VIEW  Comparison: 07/08/2010.  Findings: Cardiopericardial silhouette appears within normal limits.  No airspace disease.  No effusion.  Unchanged thoracic kyphosis with lower thoracic compression fractures. Monitoring leads are projected over the chest.  IMPRESSION: No active cardiopulmonary disease or interval change compared to prior.  Original Report Authenticated By: Andreas Newport, M.D.    Review of Systems    Constitutional: Negative for fever, chills, weight loss, malaise/fatigue and diaphoresis.  HENT: Negative for hearing loss, ear pain, nosebleeds, congestion, sore throat, neck pain, tinnitus and ear discharge.   Eyes: Negative for blurred vision, double vision, photophobia, pain, discharge and redness.  Respiratory: Negative for cough, hemoptysis, sputum production, shortness of breath and wheezing.   Cardiovascular: Positive for palpitations. Negative for chest pain, orthopnea, claudication, leg swelling and PND.  Gastrointestinal: Negative for heartburn, nausea, vomiting, abdominal pain, diarrhea, constipation, blood in stool and melena.  Genitourinary: Negative for dysuria, urgency, frequency, hematuria and flank pain.  Musculoskeletal: Negative for myalgias.  Skin: Negative for itching and rash.  Neurological: Positive for dizziness. Negative for tingling, tremors, sensory change, weakness and headaches.  Psychiatric/Behavioral: Negative for suicidal ideas. The patient is not nervous/anxious.     Blood pressure 136/83, pulse 93, temperature 98.3 F (36.8 C), temperature source Oral, resp. rate 18, SpO2 96.00%. Physical Exam  Constitutional: He is oriented to person, place, and time. He appears well-developed and well-nourished. No distress.  HENT:  Head: Normocephalic and atraumatic.  Eyes: EOM are normal. Right eye exhibits no discharge. Left eye exhibits no discharge. No scleral icterus.  Neck: Normal range of motion. Neck supple. No JVD present. No tracheal deviation present. No thyromegaly present.  Cardiovascular: Normal heart sounds.        Tachycardic (HR 107 bpm) with irregularly irregular rhythm  Respiratory: No stridor. No respiratory distress. He has no wheezes. He has no rales. He exhibits no tenderness.  GI: He exhibits no distension. There is no tenderness. There is no rebound and no guarding.  Musculoskeletal: He exhibits no edema and no tenderness.  Neurological: He is  alert and oriented to person, place, and time.  Skin: No rash noted. He is not diaphoretic. No erythema.  Psychiatric: He has a normal mood and affect.    ECG: AFIB with RVR, heart rate 123 bpm; no ST or T wave changes to suggest ischemia  Assessment/Plan  1 AFIB with  RVR  2. HTN 3. Hyperlipidemia  The patient presents with AFIB with RVR likely secondary to medication non-compliance.  He has been started on cardizem drip, and his heart rate is currently in the 100 bpm to 110 bpm rate with no symptoms at this time.  I will increase his Cardizem from 180 mg to 240 mg q daily (will give tonight) and wean Cardizem drip as tolerated by heart rate.  He will remain on Pradaxa for anticoagulation.  I will observe him on telemetry overnight on cardiology service and obtain serial cardiac markers to see if he rules in for MI.  Since his last TTE was in 2007, I will order a TTE to determine if he still has normal left ventricular systolic function since he is at risk of tachycardia-induced cardiomyopathy, given his non-compliance with medication. This will also help to determine if he still needs to remain on Flecainide.  Loreda Silverio E 11/06/2011, 7:05 PM

## 2011-11-06 NOTE — ED Notes (Signed)
Pt reports 1 week history of atrial fibrillation. Pt had print out of his BP and pulse, Pt BP at home 76/51 at 1210. Pt reports symptoms onset June 25 on and off. Pt called his PMD today and was told to come to ED for further eval. Pt denies chest pain and N/V at present.  Pt does report feeling dizzy and sweaty.

## 2011-11-06 NOTE — ED Notes (Signed)
Pt reports taken Z-pack for bronchitis

## 2011-11-06 NOTE — ED Notes (Addendum)
Pt reports onset of A-fib 1 week ago hx of the same, pt reports weakness, dizziness, and diaphoretic. 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 Pt denies chest/abd pain, N/V, or fever. Per son pt was weak, dizzy, and confused prior to coming to ED. Pt called pcp and instructed to come to ED. Pt reports he just finished taking some mucinex and a round of ABX for Bronchitis. Pt reports a productive cough 3 weeks ago and now has a "nagging" dry cough. Pt also reports hx of lower back pain and recently dx w/arthritis to L-4 region

## 2011-11-07 ENCOUNTER — Encounter (HOSPITAL_COMMUNITY): Payer: Self-pay | Admitting: Nurse Practitioner

## 2011-11-07 DIAGNOSIS — Z91199 Patient's noncompliance with other medical treatment and regimen due to unspecified reason: Secondary | ICD-10-CM

## 2011-11-07 DIAGNOSIS — I4891 Unspecified atrial fibrillation: Principal | ICD-10-CM

## 2011-11-07 DIAGNOSIS — Z9119 Patient's noncompliance with other medical treatment and regimen: Secondary | ICD-10-CM

## 2011-11-07 LAB — LIPID PANEL
Cholesterol: 143 mg/dL (ref 0–200)
Total CHOL/HDL Ratio: 4.3 RATIO
Triglycerides: 98 mg/dL (ref <150)
VLDL: 20 mg/dL (ref 0–40)

## 2011-11-07 LAB — CARDIAC PANEL(CRET KIN+CKTOT+MB+TROPI)
CK, MB: 1.9 ng/mL (ref 0.3–4.0)
Total CK: 49 U/L (ref 7–232)

## 2011-11-07 LAB — BASIC METABOLIC PANEL
CO2: 22 mEq/L (ref 19–32)
Calcium: 8.9 mg/dL (ref 8.4–10.5)
Chloride: 103 mEq/L (ref 96–112)
Sodium: 137 mEq/L (ref 135–145)

## 2011-11-07 LAB — TSH: TSH: 1.194 u[IU]/mL (ref 0.350–4.500)

## 2011-11-07 MED ORDER — DILTIAZEM HCL ER 180 MG PO CP24: 180.0000 mg | ORAL_CAPSULE | Freq: Every day | ORAL | Status: DC

## 2011-11-07 NOTE — Progress Notes (Signed)
Pt. Discharged 11/07/2011  12:59 PM Discharge instructions reviewed with patient/family. Patient/family verbalized understanding. All Rx's given. Questions answered as needed. Pt. Discharged to home with family/self.  William Stein

## 2011-11-07 NOTE — Progress Notes (Signed)
Patient given Off the Beat book for patient education. Will continue to monitor. 

## 2011-11-07 NOTE — Progress Notes (Signed)
Telemetry clerk notified RN that patient had converted to NSR at 0542 and was SB with a HR 53.  Telemetry strip placed in chart. Cardizem drip was stopped per MD order. EKG was performed which illustrated SB. Notified Dr. Katha Cabal and no further orders received. Will continue to monitor.

## 2011-11-07 NOTE — Progress Notes (Signed)
SUBJECTIVE:  Feels back to baseline.  No pain.  No lightheadedness   PHYSICAL EXAM Filed Vitals:   11/06/11 2200 11/06/11 2247 11/07/11 0459 11/07/11 0747  BP: 100/54 125/71 99/63 99/63   Pulse: 79 69 94 94  Temp:  97.7 F (36.5 C) 98.1 F (36.7 C) 98.1 F (36.7 C)  TempSrc:  Oral Oral Oral  Resp: 20 18 17 17   Height:    5\' 8"  (1.727 m)  Weight:   194 lb (87.998 kg) 193 lb 12.6 oz (87.9 kg)  SpO2: 98% 99% 96% 96%   General:  No distress Lungs:  Clear Heart:  RRR Abdomen:  Positive bowel sounds, no rebound no guarding Extremities:  No edema  LABS: Lab Results  Component Value Date   CKTOTAL 49 11/06/2011   CKMB 1.9 11/06/2011   TROPONINI <0.30 11/06/2011   Results for orders placed during the hospital encounter of 11/06/11 (from the past 24 hour(s))  CBC WITH DIFFERENTIAL     Status: Abnormal   Collection Time   11/06/11  2:06 PM      Component Value Range   WBC 7.0  4.0 - 10.5 K/uL   RBC 4.42  4.22 - 5.81 MIL/uL   Hemoglobin 14.2  13.0 - 17.0 g/dL   HCT 16.1  09.6 - 04.5 %   MCV 92.3  78.0 - 100.0 fL   MCH 32.1  26.0 - 34.0 pg   MCHC 34.8  30.0 - 36.0 g/dL   RDW 40.9  81.1 - 91.4 %   Platelets 241  150 - 400 K/uL   Neutrophils Relative 38 (*) 43 - 77 %   Neutro Abs 2.6  1.7 - 7.7 K/uL   Lymphocytes Relative 52 (*) 12 - 46 %   Lymphs Abs 3.6  0.7 - 4.0 K/uL   Monocytes Relative 8  3 - 12 %   Monocytes Absolute 0.6  0.1 - 1.0 K/uL   Eosinophils Relative 3  0 - 5 %   Eosinophils Absolute 0.2  0.0 - 0.7 K/uL   Basophils Relative 0  0 - 1 %   Basophils Absolute 0.0  0.0 - 0.1 K/uL  BASIC METABOLIC PANEL     Status: Abnormal   Collection Time   11/06/11  2:06 PM      Component Value Range   Sodium 136  135 - 145 mEq/L   Potassium 4.1  3.5 - 5.1 mEq/L   Chloride 100  96 - 112 mEq/L   CO2 25  19 - 32 mEq/L   Glucose, Bld 97  70 - 99 mg/dL   BUN 18  6 - 23 mg/dL   Creatinine, Ser 7.82  0.50 - 1.35 mg/dL   Calcium 9.3  8.4 - 95.6 mg/dL   GFR calc non Af Amer 50 (*)  >90 mL/min   GFR calc Af Amer 58 (*) >90 mL/min  TROPONIN I     Status: Normal   Collection Time   11/06/11  3:42 PM      Component Value Range   Troponin I <0.30  <0.30 ng/mL  CARDIAC PANEL(CRET KIN+CKTOT+MB+TROPI)     Status: Normal   Collection Time   11/06/11 11:38 PM      Component Value Range   Total CK 49  7 - 232 U/L   CK, MB 1.9  0.3 - 4.0 ng/mL   Troponin I <0.30  <0.30 ng/mL   Relative Index RELATIVE INDEX IS INVALID  0.0 - 2.5  Intake/Output Summary (Last 24 hours) at 11/07/11 0922 Last data filed at 11/07/11 0800  Gross per 24 hour  Intake    280 ml  Output   2000 ml  Net  -1720 ml    EKG:  NSR, axis WNL, intervals WNL, no acute ST T wave changes.  ASSESSMENT AND PLAN:  1. AFIB with RVR: Back in atrial fib.  He needs to be compliant with his medications. He can have an outpatient echocardiogram.  Follow up with Dr. Ladona Ridgel.  2. HTN:  BP is somewhat soft.  I will not send him home on increased dose Cardizem.  Continue with previous 180 mg dose.   William Stein 11/07/2011 9:22 AM

## 2011-11-07 NOTE — Discharge Summary (Signed)
Patient ID: William Stein,  MRN: [id number], DOB/AGE: [birth year] 78 y.o.  Admit date: 11/06/2011 Discharge date: 11/07/2011  Primary Care Provider: PANG,RICHARD Primary Cardiologist: G. Ladona Ridgel, MD  Discharge Diagnoses Principal Problem:  *Atrial fibrillation  * On flecainide & pradaxa. Active Problems:  Essential hypertension, benign  Noncompliance   Allergies No Known Allergies  Procedures  None  History of Present Illness  78 y/o male with prior h/o PAF managed with flecainide and pradaxa @ home.  Unfortunately, pt reports that he often misses his medications for one reason or another.  On the day of admission, pt felt dizzy and took his BP @ home and it read 73/56.  He presented to the [facility] ED where he was found to be in afib with rvr @ a rate of 123.  He was placed on a diltiazem gtt with some improvement in rate control.  He was admitted for further evaluation.  Hospital Course  Following admission, his home meds were resumed and initially IV diltiazem was maintained.  Pt converted to sinus rhythm just before 6 AM this morning.  Diltiazem has been transitioned back to an oral formulation (his prior home dose of 180mg ) and he has been counseled on the importance of medication compliance.  We plan to d/c him today in good condition.  We will arrange for an outpatient 2D echo and f/u with Dr. Ladona Ridgel in approximately 4 wks.  Discharge Vitals Blood pressure 99/63, pulse 94, temperature 98.1 F (36.7 C), temperature source Oral, resp. rate 17, height 5\' 8"  (1.727 m), weight 193 lb 12.6 oz (87.9 kg), SpO2 96.00%.  Filed Weights   11/07/11 0459 11/07/11 0747  Weight: 194 lb (87.998 kg) 193 lb 12.6 oz (87.9 kg)   Labs  CBC  Basename 11/06/11 1406  WBC 7.0  NEUTROABS 2.6  HGB 14.2  HCT 40.8  MCV 92.3  PLT 241   Basic Metabolic Panel  Basename 11/06/11 1406  NA 136  K 4.1  CL 100  CO2 25  GLUCOSE 97  BUN 18  CREATININE 1.32  CALCIUM 9.3  MG --  PHOS --    Cardiac Enzymes  Basename 11/06/11 2338 11/06/11 1542  CKTOTAL 49 --  CKMB 1.9 --  CKMBINDEX -- --  TROPONINI <0.30 <0.30   Disposition  Pt is being discharged home today in good condition.  Follow-up Plans & Appointments  Follow-up Information    Follow up with Lewayne Bunting, MD in 4 weeks. (we will arrange.)    Contact information:   [address] [place] [address] [phone]        Discharge Medications  Medication List  As of 11/07/2011  9:43 AM   TAKE these medications         cholecalciferol 1000 UNITS tablet   Commonly known as: VITAMIN D   Take 1,000 Units by mouth daily.      clonazePAM 0.5 MG tablet   Commonly known as: KLONOPIN   Take 0.5 mg by mouth 2 (two) times daily as needed. For anxiety      diltiazem 180 MG 24 hr capsule   Commonly known as: DILACOR XR   Take 1 capsule (180 mg total) by mouth daily.      flecainide 100 MG tablet   Commonly known as: TAMBOCOR   Take 1 tablet (100 mg total) by mouth 2 (two) times daily.      multivitamin tablet   Take 1 tablet by mouth  daily.      omeprazole 20 MG tablet   Commonly known as: PRILOSEC OTC   Take 20 mg by mouth daily.      PRADAXA 150 MG Caps   Generic drug: dabigatran   Take 150 mg by mouth 2 (two) times daily.      sodium chloride 0.65 % nasal spray   Commonly known as: OCEAN   Place 2 sprays into the nose 2 (two) times daily.      traMADol 50 MG tablet   Commonly known as: ULTRAM   Take 50 mg by mouth 2 (two) times daily as needed. For pain      vitamin E 400 UNIT capsule   Take 400 Units by mouth daily.          Outstanding Labs/Studies  None  Duration of Discharge Encounter   Greater than 30 minutes including physician time.  Signed, Nicolasa Ducking NP 11/07/2011, 9:43 AM   Patient seen and examined.  Plan as discussed in my rounding note for today and outlined above. Rollene Rotunda  11/07/2011  9:58 AM

## 2011-11-08 ENCOUNTER — Telehealth: Payer: Self-pay | Admitting: Internal Medicine

## 2011-11-08 NOTE — Telephone Encounter (Signed)
Spoke with patient he stated he was discharged from hospital yesterday 11/07/11 and was told he would have to get a return to work note from [employer].Patient was told Dr.Taylor not in office today.Will forward to Dr.Taylor's nurse.

## 2011-11-08 NOTE — Telephone Encounter (Signed)
Please return call to patient at [phone] regarding return to work documentation.

## 2011-11-08 NOTE — Telephone Encounter (Signed)
Fu call Pt was calling about note to go back to work. Please call

## 2011-11-09 DIAGNOSIS — I10 Essential (primary) hypertension: Secondary | ICD-10-CM | POA: Diagnosis not present

## 2011-11-09 DIAGNOSIS — I4891 Unspecified atrial fibrillation: Secondary | ICD-10-CM | POA: Diagnosis not present

## 2011-11-10 ENCOUNTER — Encounter: Payer: Self-pay | Admitting: *Deleted

## 2011-11-10 NOTE — Telephone Encounter (Signed)
Note written and placed at front desk.  Pt notified.

## 2011-11-11 ENCOUNTER — Telehealth: Payer: Self-pay | Admitting: Internal Medicine

## 2011-11-11 NOTE — Telephone Encounter (Signed)
Fu call °Pt returning your call  °

## 2011-11-11 NOTE — Telephone Encounter (Signed)
lmom for pt to call me back

## 2011-11-16 ENCOUNTER — Other Ambulatory Visit (HOSPITAL_COMMUNITY): Payer: Self-pay | Admitting: Internal Medicine

## 2011-11-16 DIAGNOSIS — I4891 Unspecified atrial fibrillation: Secondary | ICD-10-CM

## 2011-11-17 ENCOUNTER — Ambulatory Visit (HOSPITAL_COMMUNITY): Payer: PRIVATE HEALTH INSURANCE | Attending: Cardiology | Admitting: Radiology

## 2011-11-17 DIAGNOSIS — I4891 Unspecified atrial fibrillation: Secondary | ICD-10-CM

## 2011-11-17 DIAGNOSIS — I10 Essential (primary) hypertension: Secondary | ICD-10-CM | POA: Insufficient documentation

## 2011-11-17 DIAGNOSIS — I517 Cardiomegaly: Secondary | ICD-10-CM | POA: Insufficient documentation

## 2011-11-17 NOTE — Progress Notes (Signed)
Echocardiogram performed.  

## 2011-12-07 ENCOUNTER — Ambulatory Visit (INDEPENDENT_AMBULATORY_CARE_PROVIDER_SITE_OTHER): Payer: PRIVATE HEALTH INSURANCE | Admitting: Internal Medicine

## 2011-12-07 ENCOUNTER — Encounter: Payer: Self-pay | Admitting: Internal Medicine

## 2011-12-07 VITALS — BP 120/62 | HR 63 | Ht 68.0 in | Wt 193.4 lb

## 2011-12-07 DIAGNOSIS — I4891 Unspecified atrial fibrillation: Secondary | ICD-10-CM

## 2011-12-07 DIAGNOSIS — I10 Essential (primary) hypertension: Secondary | ICD-10-CM

## 2011-12-07 DIAGNOSIS — I498 Other specified cardiac arrhythmias: Secondary | ICD-10-CM | POA: Diagnosis not present

## 2011-12-07 NOTE — Assessment & Plan Note (Signed)
He is maintaining NSR since going back on his flecainide. I have asked that he not miss his medications.

## 2011-12-07 NOTE — Progress Notes (Signed)
HPI [name] returns today for followup. He is a pleasant 78 yo man with an HTN, PAF, and has been well controlled with Flecainide. He notes that he was in the hospital several weeks ago after he missed his dose of flecainide. He has done well since then and has not missed any additional doses of medication. No chest pain or sob. He continues to work about 35 hours a week. No Known Allergies   Current Outpatient Prescriptions  Medication Sig Dispense Refill  . cholecalciferol (VITAMIN D) 1000 UNITS tablet Take 1,000 Units by mouth daily.      . clonazePAM (KLONOPIN) 0.5 MG tablet Take 0.5 mg by mouth 2 (two) times daily as needed. For anxiety      . dabigatran (PRADAXA) 150 MG CAPS Take 150 mg by mouth 2 (two) times daily.        Marland Kitchen diltiazem (DILACOR XR) 180 MG 24 hr capsule Take 1 capsule (180 mg total) by mouth daily.  30 capsule  6  . flecainide (TAMBOCOR) 100 MG tablet Take 1 tablet (100 mg total) by mouth 2 (two) times daily.  60 tablet  10  . Multiple Vitamin (MULTIVITAMIN) tablet Take 1 tablet by mouth daily.        Marland Kitchen omeprazole (PRILOSEC OTC) 20 MG tablet Take 20 mg by mouth daily.        . sodium chloride (OCEAN) 0.65 % nasal spray Place 2 sprays into the nose 2 (two) times daily.      . traMADol (ULTRAM) 50 MG tablet Take 50 mg by mouth 2 (two) times daily as needed. For pain      . vitamin E 400 UNIT capsule Take 400 Units by mouth daily.         Past Medical History  Diagnosis Date  . Bradycardia   . Atrial fibrillation     a. On flecainide and pradaxa - ? compliance  . HTN (hypertension)   . HLD (hyperlipidemia)   . GERD (gastroesophageal reflux disease)   . Noncompliance     ROS:   All systems reviewed and negative except as noted in the HPI.   Past Surgical History  Procedure Date  . Doppler echocardiography 2011  . Exercise stress test 2012  . Colonoscopy 2003  . Cardiac catheterization 2009     Family History  Problem Relation Age of Onset  .  Dementia Father 86  . Other Mother 12    old age  . Coronary artery disease Neg Hx      History   Social History  . Marital Status: Widowed    Spouse Name: N/A    Number of Children: N/A  . Years of Education: N/A   Occupational History  . Full time [employer]   Social History Main Topics  . Smoking status: Never Smoker   . Smokeless tobacco: Not on file  . Alcohol Use: No  . Drug Use: No  . Sexually Active: Not on file   Other Topics Concern  . Not on file   Social History Narrative   Widowed      BP 120/62  Pulse 63  Ht 5\' 8"  (1.727 m)  Wt 193 lb 6.4 oz (87.726 kg)  BMI 29.41 kg/m2  Physical Exam:  Well appearing NAD HEENT: Unremarkable Neck:  No JVD, no thyromegally Lungs:  Clear with no wheezes, rales, or rhonchi. HEART:  Regular rate rhythm, no murmurs, no rubs, no clicks Abd:  soft, positive bowel sounds,  no organomegally, no rebound, no guarding Ext:  2 plus pulses, no edema, no cyanosis, no clubbing Skin:  No rashes no nodules Neuro:  CN II through XII intact, motor grossly intact  EKG NSR   Assess/Plan:

## 2011-12-07 NOTE — Assessment & Plan Note (Signed)
His blood pressure appears to be well controlled. He will continue his current meds. 

## 2011-12-07 NOTE — Assessment & Plan Note (Signed)
He is currently asymptomatic. Will follow. No indication for a PPM at this time.

## 2011-12-07 NOTE — Patient Instructions (Addendum)
Your physician wants you to follow-up in: 6 months with Dr Taylor You will receive a reminder letter in the mail two months in advance. If you don't receive a letter, please call our office to schedule the follow-up appointment.  

## 2012-02-03 DIAGNOSIS — I4891 Unspecified atrial fibrillation: Secondary | ICD-10-CM | POA: Diagnosis not present

## 2012-02-09 DIAGNOSIS — L989 Disorder of the skin and subcutaneous tissue, unspecified: Secondary | ICD-10-CM | POA: Diagnosis not present

## 2012-02-09 DIAGNOSIS — R7309 Other abnormal glucose: Secondary | ICD-10-CM | POA: Diagnosis not present

## 2012-02-09 DIAGNOSIS — I4891 Unspecified atrial fibrillation: Secondary | ICD-10-CM | POA: Diagnosis not present

## 2012-02-21 ENCOUNTER — Other Ambulatory Visit: Payer: Self-pay

## 2012-03-16 ENCOUNTER — Other Ambulatory Visit: Payer: Self-pay | Admitting: Dermatology

## 2012-03-16 DIAGNOSIS — C44721 Squamous cell carcinoma of skin of unspecified lower limb, including hip: Secondary | ICD-10-CM | POA: Diagnosis not present

## 2012-04-14 DIAGNOSIS — L821 Other seborrheic keratosis: Secondary | ICD-10-CM | POA: Diagnosis not present

## 2012-04-14 DIAGNOSIS — L723 Sebaceous cyst: Secondary | ICD-10-CM | POA: Diagnosis not present

## 2012-04-14 DIAGNOSIS — L820 Inflamed seborrheic keratosis: Secondary | ICD-10-CM | POA: Diagnosis not present

## 2012-04-14 DIAGNOSIS — D1801 Hemangioma of skin and subcutaneous tissue: Secondary | ICD-10-CM | POA: Diagnosis not present

## 2012-04-14 DIAGNOSIS — L570 Actinic keratosis: Secondary | ICD-10-CM | POA: Diagnosis not present

## 2012-04-14 DIAGNOSIS — D1739 Benign lipomatous neoplasm of skin and subcutaneous tissue of other sites: Secondary | ICD-10-CM | POA: Diagnosis not present

## 2012-04-14 DIAGNOSIS — Z85828 Personal history of other malignant neoplasm of skin: Secondary | ICD-10-CM | POA: Diagnosis not present

## 2012-05-09 ENCOUNTER — Encounter: Payer: Self-pay | Admitting: Internal Medicine

## 2012-05-09 ENCOUNTER — Ambulatory Visit (INDEPENDENT_AMBULATORY_CARE_PROVIDER_SITE_OTHER): Payer: PRIVATE HEALTH INSURANCE | Admitting: Internal Medicine

## 2012-05-09 VITALS — BP 137/72 | HR 78 | Ht 67.0 in | Wt 198.0 lb

## 2012-05-09 DIAGNOSIS — I4891 Unspecified atrial fibrillation: Secondary | ICD-10-CM

## 2012-05-09 DIAGNOSIS — I10 Essential (primary) hypertension: Secondary | ICD-10-CM

## 2012-05-09 NOTE — Progress Notes (Signed)
HPI William Stein returns today for followup. He is a very pleasant 79 year old man with a history of atrial fibrillation, sinus bradycardia, and hypertension. He has insomnia. He works the night shift. The patient has had no symptomatic atrial fibrillation in the last year. He continues taking flecainide in conjunction with anticoagulation and diltiazem. No syncope. He states that his blood pressure has been fairly well-controlled. 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 No weight changes. No Known Allergies   Current Outpatient Prescriptions  Medication Sig Dispense Refill  . cholecalciferol (VITAMIN D) 1000 UNITS tablet Take 1,000 Units by mouth daily.      . clonazePAM (KLONOPIN) 0.5 MG tablet Take 0.5 mg by mouth 2 (two) times daily as needed. For anxiety      . dabigatran (PRADAXA) 150 MG CAPS Take 150 mg by mouth 2 (two) times daily.        Marland Kitchen diltiazem (DILACOR XR) 180 MG 24 hr capsule Take 1 capsule (180 mg total) by mouth daily.  30 capsule  6  . flecainide (TAMBOCOR) 100 MG tablet Take 1 tablet (100 mg total) by mouth 2 (two) times daily.  60 tablet  10  . Multiple Vitamin (MULTIVITAMIN) tablet Take 1 tablet by mouth daily.        Marland Kitchen omeprazole (PRILOSEC OTC) 20 MG tablet Take 20 mg by mouth daily.        . sodium chloride (OCEAN) 0.65 % nasal spray Place 2 sprays into the nose 2 (two) times daily.      . traMADol (ULTRAM) 50 MG tablet Take 50 mg by mouth 2 (two) times daily as needed. For pain      . vitamin E 400 UNIT capsule Take 400 Units by mouth daily.         Past Medical History  Diagnosis Date  . Bradycardia   . Atrial fibrillation     a. On flecainide and pradaxa - ? compliance  . HTN (hypertension)   . HLD (hyperlipidemia)   . GERD (gastroesophageal reflux disease)   . Noncompliance     ROS:   All systems reviewed and negative except as noted in the HPI.   Past Surgical History  Procedure Date  . Doppler echocardiography  2011  . Exercise stress test 2012  . Colonoscopy 2003  . Cardiac catheterization 2009     Family History  Problem Relation Age of Onset  . Dementia Father 70  . Other Mother 39    old age  . Coronary artery disease Neg Hx      History   Social History  . Marital Status: Widowed    Spouse Name: N/A    Number of Children: N/A  . Years of Education: N/A   Occupational History  . Full time [employer]   Social History Main Topics  . Smoking status: Never Smoker   . Smokeless tobacco: Not on file  . Alcohol Use: No  . Drug Use: No  . Sexually Active: Not on file   Other Topics Concern  . Not on file   Social History Narrative   Widowed      BP 137/72  Pulse 78  Ht 5\' 7"  (1.702 m)  Wt 198 lb (89.812 kg)  BMI 31.01 kg/m2  Physical Exam:  Well appearing 79 year old man, NAD HEENT: Unremarkable Neck:  No JVD, no thyromegally Lungs:  Clear with no wheezes, rales, or rhonchi. HEART:  Regular rate rhythm, no murmurs,  no rubs, no clicks Abd:  soft, positive bowel sounds, no organomegally, no rebound, no guarding Ext:  2 plus pulses, no edema, no cyanosis, no clubbing Skin:  No rashes no nodules Neuro:  CN II through XII intact, motor grossly intact  EKG Normal sinus rhythm.  Assess/Plan:

## 2012-05-09 NOTE — Assessment & Plan Note (Signed)
He is maintaining sinus rhythm very nicely on flecainide. He is currently asymptomatic. I'll see him back in a year. No change in medical therapy.

## 2012-05-09 NOTE — Assessment & Plan Note (Signed)
His blood pressure has been well-controlled. He'll continue his current medical therapy, and he will maintain a low-sodium diet.

## 2012-05-09 NOTE — Patient Instructions (Signed)
Your physician wants you to follow-up in: 1 year with Dr. Taylor. You will receive a reminder letter in the mail two months in advance. If you don't receive a letter, please call our office to schedule the follow-up appointment.  

## 2012-06-09 DIAGNOSIS — R7309 Other abnormal glucose: Secondary | ICD-10-CM | POA: Diagnosis not present

## 2012-06-09 DIAGNOSIS — I4891 Unspecified atrial fibrillation: Secondary | ICD-10-CM | POA: Diagnosis not present

## 2012-06-12 ENCOUNTER — Other Ambulatory Visit (HOSPITAL_COMMUNITY): Payer: Self-pay | Admitting: Nurse Practitioner

## 2012-06-14 DIAGNOSIS — I4891 Unspecified atrial fibrillation: Secondary | ICD-10-CM | POA: Diagnosis not present

## 2012-06-14 DIAGNOSIS — Z0000 Encounter for general adult medical examination without abnormal findings: Secondary | ICD-10-CM | POA: Diagnosis not present

## 2012-06-14 DIAGNOSIS — Z1331 Encounter for screening for depression: Secondary | ICD-10-CM | POA: Diagnosis not present

## 2012-08-04 ENCOUNTER — Other Ambulatory Visit: Payer: Self-pay | Admitting: Internal Medicine

## 2012-09-01 ENCOUNTER — Other Ambulatory Visit: Payer: Self-pay | Admitting: Internal Medicine

## 2012-09-11 DIAGNOSIS — I10 Essential (primary) hypertension: Secondary | ICD-10-CM | POA: Diagnosis not present

## 2012-09-11 DIAGNOSIS — I4891 Unspecified atrial fibrillation: Secondary | ICD-10-CM | POA: Diagnosis not present

## 2012-09-11 DIAGNOSIS — R7309 Other abnormal glucose: Secondary | ICD-10-CM | POA: Diagnosis not present

## 2012-10-12 ENCOUNTER — Other Ambulatory Visit: Payer: Self-pay | Admitting: Internal Medicine

## 2012-12-27 ENCOUNTER — Other Ambulatory Visit: Payer: Self-pay

## 2012-12-27 MED ORDER — DILTIAZEM HCL ER COATED BEADS 180 MG PO CP24: ORAL_CAPSULE | ORAL | Status: DC

## 2013-01-09 DIAGNOSIS — R7309 Other abnormal glucose: Secondary | ICD-10-CM | POA: Diagnosis not present

## 2013-01-09 DIAGNOSIS — I10 Essential (primary) hypertension: Secondary | ICD-10-CM | POA: Diagnosis not present

## 2013-01-09 DIAGNOSIS — I4891 Unspecified atrial fibrillation: Secondary | ICD-10-CM | POA: Diagnosis not present

## 2013-01-15 DIAGNOSIS — I4891 Unspecified atrial fibrillation: Secondary | ICD-10-CM | POA: Diagnosis not present

## 2013-01-15 DIAGNOSIS — I10 Essential (primary) hypertension: Secondary | ICD-10-CM | POA: Diagnosis not present

## 2013-01-15 DIAGNOSIS — K219 Gastro-esophageal reflux disease without esophagitis: Secondary | ICD-10-CM | POA: Diagnosis not present

## 2013-01-15 DIAGNOSIS — R7309 Other abnormal glucose: Secondary | ICD-10-CM | POA: Diagnosis not present

## 2013-05-01 ENCOUNTER — Other Ambulatory Visit: Payer: Self-pay | Admitting: Internal Medicine

## 2013-05-10 ENCOUNTER — Ambulatory Visit: Payer: PRIVATE HEALTH INSURANCE | Admitting: Internal Medicine

## 2013-05-11 ENCOUNTER — Ambulatory Visit (INDEPENDENT_AMBULATORY_CARE_PROVIDER_SITE_OTHER): Payer: PRIVATE HEALTH INSURANCE | Admitting: Internal Medicine

## 2013-05-11 ENCOUNTER — Encounter (INDEPENDENT_AMBULATORY_CARE_PROVIDER_SITE_OTHER): Payer: Self-pay

## 2013-05-11 ENCOUNTER — Encounter: Payer: Self-pay | Admitting: Internal Medicine

## 2013-05-11 VITALS — BP 118/64 | HR 71 | Ht 67.0 in | Wt 195.8 lb

## 2013-05-11 DIAGNOSIS — I4891 Unspecified atrial fibrillation: Secondary | ICD-10-CM | POA: Diagnosis not present

## 2013-05-11 DIAGNOSIS — R7309 Other abnormal glucose: Secondary | ICD-10-CM | POA: Diagnosis not present

## 2013-05-11 DIAGNOSIS — I10 Essential (primary) hypertension: Secondary | ICD-10-CM | POA: Diagnosis not present

## 2013-05-11 NOTE — Patient Instructions (Addendum)
Your physician wants you to follow-up in: 1 year with Dr. Lovena Le. You will receive a reminder letter in the mail two months in advance. If you don't receive a letter, please call our office to schedule the follow-up appointment.  No changes were made today, please continue your current medications.

## 2013-05-11 NOTE — Progress Notes (Signed)
HPI Mr. Esterline returns today for followup. He is a very pleasant 80 year old man with a history of atrial fibrillation, sinus bradycardia, and hypertension. He has insomnia. He works the night shift. The patient has had no symptomatic atrial fibrillation in the last year. He continues taking flecainide in conjunction with anticoagulation and diltiazem. No syncope. He states that his blood pressure has been fairly well-controlled. 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 No weight changes. No Known Allergies   Current Outpatient Prescriptions  Medication Sig Dispense Refill  . cholecalciferol (VITAMIN D) 1000 UNITS tablet Take 1,000 Units by mouth daily.      . clonazePAM (KLONOPIN) 0.5 MG tablet Take 0.5 mg by mouth 2 (two) times daily as needed. For anxiety      . dabigatran (PRADAXA) 150 MG CAPS Take 150 mg by mouth 2 (two) times daily.        Marland Kitchen diltiazem (CARDIZEM CD) 180 MG 24 hr capsule TAKE ONE CAPSULE BY MOUTH EVERY DAY  30 capsule  5  . flecainide (TAMBOCOR) 100 MG tablet TAKE ONE TABLET BY MOUTH TWICE DAILY  60 tablet  0  . Multiple Vitamin (MULTIVITAMIN) tablet Take 1 tablet by mouth daily.        Marland Kitchen omeprazole (PRILOSEC OTC) 20 MG tablet Take 20 mg by mouth daily.       . sodium chloride (OCEAN) 0.65 % nasal spray Place 2 sprays into the nose 2 (two) times daily.      . vitamin E 400 UNIT capsule Take 400 Units by mouth daily.      . [DISCONTINUED] diltiazem (DILACOR XR) 180 MG 24 hr capsule Take 1 capsule (180 mg total) by mouth daily.  30 capsule  6   No current facility-administered medications for this visit.     Past Medical History  Diagnosis Date  . Bradycardia   . Atrial fibrillation     a. On flecainide and pradaxa - ? compliance  . HTN (hypertension)   . HLD (hyperlipidemia)   . GERD (gastroesophageal reflux disease)   . Noncompliance     ROS:   All systems reviewed and negative except as noted in the HPI.   Past Surgical  History  Procedure Laterality Date  . Doppler echocardiography  2011  . Exercise stress test  2012  . Colonoscopy  2003  . Cardiac catheterization  2009     Family History  Problem Relation Age of Onset  . Dementia Father 62  . Other Mother 33    old age  . Coronary artery disease Neg Hx      History   Social History  . Marital Status: Widowed    Spouse Name: N/A    Number of Children: N/A  . Years of Education: N/A   Occupational History  . Full time [employer]   Social History Main Topics  . Smoking status: Never Smoker   . Smokeless tobacco: Not on file  . Alcohol Use: No  . Drug Use: No  . Sexual Activity: Not on file   Other Topics Concern  . Not on file   Social History Narrative   Widowed      BP 118/64  Pulse 71  Ht 5\' 7"  (1.702 m)  Wt 195 lb 12.8 oz (88.814 kg)  BMI 30.66 kg/m2  Physical Exam:  Well appearing 80 year old man, NAD HEENT: Unremarkable Neck:  No JVD, no thyromegally Lungs:  Clear with no wheezes, rales,  or rhonchi. HEART:  Regular rate rhythm, no murmurs, no rubs, no clicks Abd:  soft, positive bowel sounds, no organomegally, no rebound, no guarding Ext:  2 plus pulses, no edema, no cyanosis, no clubbing Skin:  No rashes no nodules Neuro:  CN II through XII intact, motor grossly intact  EKG Normal sinus rhythm.  Assess/Plan:

## 2013-05-13 ENCOUNTER — Encounter: Payer: Self-pay | Admitting: Internal Medicine

## 2013-05-13 NOTE — Assessment & Plan Note (Signed)
His blood pressure has been fairly well controlled. He is encouraged to maintain a low sodium diet.

## 2013-05-13 NOTE — Assessment & Plan Note (Signed)
He is maintaining NSR very nicely. He will continue his current dose of flecainide. No other change in meds.

## 2013-05-17 DIAGNOSIS — I4891 Unspecified atrial fibrillation: Secondary | ICD-10-CM | POA: Diagnosis not present

## 2013-05-17 DIAGNOSIS — I10 Essential (primary) hypertension: Secondary | ICD-10-CM | POA: Diagnosis not present

## 2013-05-29 ENCOUNTER — Other Ambulatory Visit: Payer: Self-pay | Admitting: Internal Medicine

## 2013-05-31 ENCOUNTER — Telehealth: Payer: Self-pay | Admitting: Internal Medicine

## 2013-05-31 NOTE — Telephone Encounter (Signed)
New problem    Pt's son stated Dr Lovena Le had spoken to him concerning his dad. Please have Dr Lovena Le to call pt's son.

## 2013-06-01 NOTE — Telephone Encounter (Signed)
Left message for son and let him know Dr Lovena Le is not here and I can not talk to him about his fathers healthcare unless he is POA and or we have permission from his Dad documented  I have asked him to have his Dad call and I will be glad to talk with him

## 2013-06-30 ENCOUNTER — Other Ambulatory Visit: Payer: Self-pay | Admitting: Internal Medicine

## 2013-07-05 DIAGNOSIS — L821 Other seborrheic keratosis: Secondary | ICD-10-CM | POA: Diagnosis not present

## 2013-07-05 DIAGNOSIS — Z85828 Personal history of other malignant neoplasm of skin: Secondary | ICD-10-CM | POA: Diagnosis not present

## 2013-07-05 DIAGNOSIS — L738 Other specified follicular disorders: Secondary | ICD-10-CM | POA: Diagnosis not present

## 2013-07-05 DIAGNOSIS — L820 Inflamed seborrheic keratosis: Secondary | ICD-10-CM | POA: Diagnosis not present

## 2013-07-05 DIAGNOSIS — L259 Unspecified contact dermatitis, unspecified cause: Secondary | ICD-10-CM | POA: Diagnosis not present

## 2013-07-05 DIAGNOSIS — L570 Actinic keratosis: Secondary | ICD-10-CM | POA: Diagnosis not present

## 2013-08-23 DIAGNOSIS — F3289 Other specified depressive episodes: Secondary | ICD-10-CM | POA: Diagnosis not present

## 2013-08-23 DIAGNOSIS — F329 Major depressive disorder, single episode, unspecified: Secondary | ICD-10-CM | POA: Diagnosis not present

## 2013-08-23 DIAGNOSIS — M5380 Other specified dorsopathies, site unspecified: Secondary | ICD-10-CM | POA: Diagnosis not present

## 2013-09-08 ENCOUNTER — Encounter (HOSPITAL_COMMUNITY): Payer: Self-pay | Admitting: Emergency Medicine

## 2013-09-08 ENCOUNTER — Emergency Department (HOSPITAL_COMMUNITY): Payer: PRIVATE HEALTH INSURANCE

## 2013-09-08 ENCOUNTER — Emergency Department (HOSPITAL_COMMUNITY)
Admission: EM | Admit: 2013-09-08 | Discharge: 2013-09-08 | Disposition: A | Discharge status: Home / Self Care | Payer: PRIVATE HEALTH INSURANCE | Attending: Emergency Medicine | Admitting: Emergency Medicine

## 2013-09-08 DIAGNOSIS — Z9119 Patient's noncompliance with other medical treatment and regimen: Secondary | ICD-10-CM | POA: Insufficient documentation

## 2013-09-08 DIAGNOSIS — E785 Hyperlipidemia, unspecified: Secondary | ICD-10-CM | POA: Diagnosis not present

## 2013-09-08 DIAGNOSIS — K219 Gastro-esophageal reflux disease without esophagitis: Secondary | ICD-10-CM | POA: Diagnosis not present

## 2013-09-08 DIAGNOSIS — I4891 Unspecified atrial fibrillation: Secondary | ICD-10-CM | POA: Diagnosis not present

## 2013-09-08 DIAGNOSIS — Z91199 Patient's noncompliance with other medical treatment and regimen due to unspecified reason: Secondary | ICD-10-CM | POA: Insufficient documentation

## 2013-09-08 DIAGNOSIS — Z79899 Other long term (current) drug therapy: Secondary | ICD-10-CM | POA: Insufficient documentation

## 2013-09-08 DIAGNOSIS — Z9889 Other specified postprocedural states: Secondary | ICD-10-CM | POA: Insufficient documentation

## 2013-09-08 DIAGNOSIS — I10 Essential (primary) hypertension: Secondary | ICD-10-CM

## 2013-09-08 DIAGNOSIS — R0602 Shortness of breath: Secondary | ICD-10-CM | POA: Diagnosis not present

## 2013-09-08 LAB — COMPREHENSIVE METABOLIC PANEL
ALT: 27 U/L (ref 0–53)
AST: 24 U/L (ref 0–37)
Albumin: 3.8 g/dL (ref 3.5–5.2)
Alkaline Phosphatase: 72 U/L (ref 39–117)
BUN: 16 mg/dL (ref 6–23)
CO2: 21 meq/L (ref 19–32)
Calcium: 9.1 mg/dL (ref 8.4–10.5)
Chloride: 99 mEq/L (ref 96–112)
Creatinine, Ser: 1.16 mg/dL (ref 0.50–1.35)
GFR calc non Af Amer: 58 mL/min — ABNORMAL LOW (ref >90)
GFR, EST AFRICAN AMERICAN: 67 mL/min — AB (ref >90)
Glucose, Bld: 153 mg/dL — ABNORMAL HIGH (ref 70–99)
Potassium: 4.2 mEq/L (ref 3.7–5.3)
Sodium: 136 mEq/L — ABNORMAL LOW (ref 137–147)
Total Bilirubin: 0.5 mg/dL (ref 0.3–1.2)
Total Protein: 6.7 g/dL (ref 6.0–8.3)

## 2013-09-08 LAB — URINALYSIS, ROUTINE W REFLEX MICROSCOPIC
Bilirubin Urine: NEGATIVE
Glucose, UA: NEGATIVE mg/dL
HGB URINE DIPSTICK: NEGATIVE
Ketones, ur: 15 mg/dL — AB
Leukocytes, UA: NEGATIVE
Nitrite: NEGATIVE
PH: 5.5 (ref 5.0–8.0)
Protein, ur: NEGATIVE mg/dL
SPECIFIC GRAVITY, URINE: 1.031 — AB (ref 1.005–1.030)
Urobilinogen, UA: 1 mg/dL (ref 0.0–1.0)

## 2013-09-08 LAB — CBC
HCT: 42.8 % (ref 39.0–52.0)
Hemoglobin: 14.9 g/dL (ref 13.0–17.0)
MCH: 32.6 pg (ref 26.0–34.0)
MCHC: 34.8 g/dL (ref 30.0–36.0)
MCV: 93.7 fL (ref 78.0–100.0)
PLATELETS: 345 10*3/uL (ref 150–400)
RBC: 4.57 MIL/uL (ref 4.22–5.81)
RDW: 12.8 % (ref 11.5–15.5)
WBC: 9.3 10*3/uL (ref 4.0–10.5)

## 2013-09-08 LAB — I-STAT CHEM 8, ED
BUN: 16 mg/dL (ref 6–23)
CALCIUM ION: 1.19 mmol/L (ref 1.13–1.30)
Chloride: 99 mEq/L (ref 96–112)
Creatinine, Ser: 1.3 mg/dL (ref 0.50–1.35)
GLUCOSE: 154 mg/dL — AB (ref 70–99)
HEMATOCRIT: 46 % (ref 39.0–52.0)
Hemoglobin: 15.6 g/dL (ref 13.0–17.0)
POTASSIUM: 3.9 meq/L (ref 3.7–5.3)
Sodium: 137 mEq/L (ref 137–147)
TCO2: 22 mmol/L (ref 0–100)

## 2013-09-08 LAB — CBG MONITORING, ED: GLUCOSE-CAPILLARY: 141 mg/dL — AB (ref 70–99)

## 2013-09-08 LAB — TROPONIN I: Troponin I: <0.3 ng/mL (ref <0.30)

## 2013-09-08 LAB — APTT: APTT: 59 s — AB (ref 24–37)

## 2013-09-08 LAB — PROTIME-INR
INR: 1.45 (ref 0.00–1.49)
PROTHROMBIN TIME: 17.3 s — AB (ref 11.6–15.2)

## 2013-09-08 MED ORDER — FENTANYL CITRATE 0.05 MG/ML IJ SOLN
100.0000 ug | Freq: Once | INTRAMUSCULAR | Status: AC
  Administered 2013-09-08: 50 ug via INTRAVENOUS
  Filled 2013-09-08: qty 2

## 2013-09-08 MED ORDER — PROPOFOL 10 MG/ML IV BOLUS
1.0000 mg/kg | Freq: Once | INTRAVENOUS | Status: AC
  Administered 2013-09-08: 50 mg via INTRAVENOUS
  Filled 2013-09-08: qty 20

## 2013-09-08 MED ORDER — DILTIAZEM HCL 100 MG IV SOLR
5.0000 mg/h | INTRAVENOUS | Status: DC
  Administered 2013-09-08: 5 mg/h via INTRAVENOUS

## 2013-09-08 NOTE — CV Procedure (Signed)
EP procedure Note   Pre procedure Diagnosis:  Atrial fibrillation Post procedure Diagnosis:  Same  Procedures:  Electrical cardioversion  Description:  Informed, written consent was obtained for cardioversion.  Adequate IV acces and airway support were assured.  The patient was adequately sedated with intravenous propofol as outlined in the anesthesia report.  The patient presented today in atrial fibrillation.  She was successfully cardioverted to sinus rhythm with a single synchronized biphasic 200J shock delivered with cardioversion electrodes placed in the anterior/posterior configuration.  She remains in sinus rhythm thereafter.  There were no early apparent complications.  Conclusions:  1.  Successful cardioversion of afib to sinus rhythm 2.  No early apparent complications.  OK to discharge to home in 1 hour if no concerns.   No driving for 24 hours Resume home medicines at discharge Will arrange follow-up in our office in 3 weeks   Jeneen Rinks Archie Shea,MD 5:06 PM 09/08/2013

## 2013-09-08 NOTE — ED Provider Notes (Signed)
CSN: [id number]     Arrival date & time 09/08/13  1222 History   First MD Initiated Contact with Patient 09/08/13 1230     Chief Complaint  Patient presents with  . Irregular Heart Beat  . Weakness     (Consider location/radiation/quality/duration/timing/severity/associated sxs/prior Treatment) HPI  William Stein is a 81 y.o. male complaining of palpitations described as heart racing onset last night. Patient thinks this may have been set off by his nerve pain in the right shoulder by the fact that he is stressed from having 10 dogs in his house. Patient has past medical history of A. fib patient reports compliance with both Pradaxa, Cardizem and flecainide. Palpitations have been constant since onset sometimes worsening in severity. Patient denies chest pain but endorses a generalized weakness. Denies shortness of breath, nausea vomiting, headache, change in vision, unilateral weakness, ataxia, dysarthria, abdominal pain, change in bowel or bladder habits.  Cards: William Stein  Past Medical History  Diagnosis Date  . Bradycardia   . Atrial fibrillation     a. On flecainide and pradaxa - ? compliance  . HTN (hypertension)   . HLD (hyperlipidemia)   . GERD (gastroesophageal reflux disease)   . Noncompliance    Past Surgical History  Procedure Laterality Date  . Doppler echocardiography  2011  . Exercise stress test  2012  . Colonoscopy  2003  . Cardiac catheterization  2009   Family History  Problem Relation Age of Onset  . Dementia Father 55  . Other Mother 84    old age  . Coronary artery disease Neg Hx    History  Substance Use Topics  . Smoking status: Never Smoker   . Smokeless tobacco: Not on file  . Alcohol Use: No    Review of Systems  10 systems reviewed and found to be negative, except as noted in the HPI.   Allergies  Review of patient's allergies indicates no known allergies.  Home Medications   Prior to Admission medications   Medication Sig  Start Date End Date Taking? Authorizing Provider  cholecalciferol (VITAMIN D) 1000 UNITS tablet Take 1,000 Units by mouth daily.    Historical Provider, MD  clonazePAM (KLONOPIN) 0.5 MG tablet Take 0.5 mg by mouth 2 (two) times daily as needed. For anxiety    Historical Provider, MD  dabigatran (PRADAXA) 150 MG CAPS Take 150 mg by mouth 2 (two) times daily.      Historical Provider, MD  diltiazem (CARDIZEM CD) 180 MG 24 hr capsule TAKE ONE CAPSULE BY MOUTH ONCE DAILY    Evans Lance, MD  flecainide (TAMBOCOR) 100 MG tablet TAKE ONE TABLET BY MOUTH TWICE DAILY 05/29/13   Evans Lance, MD  Multiple Vitamin (MULTIVITAMIN) tablet Take 1 tablet by mouth daily.      Historical Provider, MD  omeprazole (PRILOSEC OTC) 20 MG tablet Take 20 mg by mouth daily.     Historical Provider, MD  sodium chloride (OCEAN) 0.65 % nasal spray Place 2 sprays into the nose 2 (two) times daily.    Historical Provider, MD  vitamin E 400 UNIT capsule Take 400 Units by mouth daily.    Historical Provider, MD   BP 104/49  Pulse 100  Temp(Src) 97.7 F (36.5 C) (Oral)  Resp 18  Ht 5\' 8"  (1.727 m)  Wt 198 lb (89.812 kg)  BMI 30.11 kg/m2  SpO2 97% Physical Exam  Nursing note and vitals reviewed. Constitutional: He is oriented to person, place, and time.  He appears well-developed and well-nourished. No distress.  HENT:  Head: Normocephalic and atraumatic.  Mouth/Throat: Oropharynx is clear and moist.  Eyes: Conjunctivae and EOM are normal. Pupils are equal, round, and reactive to light.  Neck: Normal range of motion.  Cardiovascular: Normal rate, regular rhythm and intact distal pulses.   Pulmonary/Chest: Effort normal and breath sounds normal. No stridor. No respiratory distress. He has no wheezes. He has no rales. He exhibits no tenderness.  Abdominal: Soft. Bowel sounds are normal. He exhibits no distension and no mass. There is no tenderness. There is no rebound and no guarding.  Musculoskeletal: Normal range of  motion. He exhibits no edema.  No calf asymmetry, superficial collaterals, palpable cords, edema, Homans sign negative bilaterally.    Neurological: He is alert and oriented to person, place, and time.  Psychiatric: He has a normal mood and affect.    ED Course  Procedures (including critical care time) Labs Review Labs Reviewed  COMPREHENSIVE METABOLIC PANEL - Abnormal; Notable for the following:    Sodium 136 (*)    Glucose, Bld 153 (*)    GFR calc non Af Amer 58 (*)    GFR calc Af Amer 67 (*)    All other components within normal limits  APTT - Abnormal; Notable for the following:    aPTT 59 (*)    All other components within normal limits  PROTIME-INR - Abnormal; Notable for the following:    Prothrombin Time 17.3 (*)    All other components within normal limits  CBG MONITORING, ED - Abnormal; Notable for the following:    Glucose-Capillary 141 (*)    All other components within normal limits  I-STAT CHEM 8, ED - Abnormal; Notable for the following:    Glucose, Bld 154 (*)    All other components within normal limits  URINE CULTURE  TROPONIN I  CBC  URINALYSIS, ROUTINE W REFLEX MICROSCOPIC  POCT CBG (FASTING - GLUCOSE)-MANUAL ENTRY    Imaging Review Dg Chest Port 1 View  09/08/2013   CLINICAL DATA:  Weakness, shortness of Breath  EXAM: PORTABLE CHEST - 1 VIEW  COMPARISON:  11/06/2011  FINDINGS: Lungs are clear. Heart size and mediastinal contours are within normal limits. No effusion. Visualized skeletal structures are unremarkable.  IMPRESSION: No acute cardiopulmonary disease.   Electronically Signed   By: Arne Cleveland M.D.   On: 09/08/2013 13:00     EKG Interpretation   Date/Time:  Saturday Sep 08 2013 12:29:01 EDT Ventricular Rate:  132 PR Interval:    QRS Duration: 92 QT Interval:  284 QTC Calculation: 420 R Axis:   70 Text Interpretation:  Atrial fibrillation with rapid ventricular response  Nonspecific ST abnormality , probably digitalis effect  Abnormal ECG  Confirmed by Zenia Resides  MD, ANTHONY ([id number]) on 09/08/2013 12:50:56 PM      MDM   Final diagnoses:  Atrial fibrillation with RVR    Filed Vitals:   09/08/13 1225 09/08/13 1234 09/08/13 1322 09/08/13 1530  BP:  98/72 104/49 120/66  Pulse:  132 100 70  Temp:  97.7 F (36.5 C)    TempSrc:  Oral    Resp:  16 18 15   Height: 5\' 8"  (1.727 m)     Weight: 198 lb (89.812 kg)     SpO2:  97% 97% 98%    Medications  diltiazem (CARDIZEM) 100 mg in dextrose 5 % 100 mL infusion (5 mg/hr Intravenous Rate/Dose Change 09/08/13 1417)    Natasha Bence is  a 80 y.o. male presenting with A. fib with rapid ventricular response and weakness, onset last night. Patient is anticoagulated. Blood pressure is soft range in the upper 90s to low 100s. Patient started on Cardizem drip 5 mg per hour. Heart rate remains tachycardic, blood pressure remained soft. Cardiology consult for management and admission. Dr. Katharina Caper consulted.   Note: Portions of this report may have been transcribed using voice recognition software. Every effort was made to ensure accuracy; however, inadvertent computerized transcription errors may be present      Monico Blitz, PA-C 09/08/13 1558

## 2013-09-08 NOTE — ED Notes (Signed)
Patient is alert and orientedx4.  Patient was explained discharge instructions and they understood them with no questions.   

## 2013-09-08 NOTE — ED Notes (Signed)
Pt states "heart racing" last night. Also states generalized weakness and fatigue. History of atrial fibrillation. Denies chest pain. Respirations unlabored. Pt is alert and oriented x4. Skin warm and dry.

## 2013-09-08 NOTE — Discharge Instructions (Signed)
NO DRIVING for 24 hours.  Take his same medications that you are currently prescribed for her atrial fibrillation. Followup with her cardiologist.: Monday morning for an appointment.

## 2013-09-08 NOTE — ED Provider Notes (Signed)
Dr. Rayann Heman requested I perform procedural sedation and cardioversion.  Procedural sedation Performed by: Johnna Acosta Consent: Verbal consent obtained. Risks and benefits: risks, benefits and alternatives were discussed Required items: required blood products, implants, devices, and special equipment available Patient identity confirmed: arm band and provided demographic data Time out: Immediately prior to procedure a "time out" was called to verify the correct patient, procedure, equipment, support staff and site/side marked as required.  Sedation type: moderate (conscious) sedation NPO time confirmed and considedered  Sedatives: PROPOFOL  Physician Time at Bedside: 20 minutes  Vitals: Vital signs were monitored during sedation. Cardiac Monitor, pulse oximeter Patient tolerance: Patient tolerated the procedure well with no immediate complications. Comments: Pt with uneventful recovered. Returned to pre-procedural sedation baseline  CARDIOVERSION Performed by: Noemi Chapel D Authorized by: Noemi Chapel D Consent: Verbal consent obtained. written consent obtained. Risks and benefits: risks, benefits and alternatives were discussed Consent given by: patient Patient understanding: patient states understanding of the procedure being performed Patient consent: the patient's understanding of the procedure matches consent given Procedure consent: procedure consent matches procedure scheduled Relevant documents: relevant documents present and verified Test results: test results available and properly labeled Site marked: the operative site was marked Imaging studies: imaging studies available Patient identity confirmed: verbally with patient and arm band Time out: Immediately prior to procedure a "time out" was called to verify the correct patient, procedure, equipment, support staff and site/side marked as required. Patient sedated: yes Sedation type: moderate (conscious)  sedation Sedatives: propofol Analgesia: fentanyl Vitals: Vital signs were monitored during sedation. Cardioversion basis: elective Indications: failure of anti-arrhythmic medications Pre-procedure rhythm: atrial fibrillation Patient position: patient was placed in a supine position Chest area: chest area exposed Electrodes: pads Electrodes placed: anterior-posterior Number of attempts: 1 Attempt 1 mode: synchronous Attempt 1 waveform: biphasic Attempt 1 shock (in Joules): 200 Attempt 1 outcome: conversion to normal sinus rhythm Post-procedure rhythm: normal sinus rhythm Complications: no complications Patient tolerance: Patient tolerated the procedure well with no immediate complications.   Cardiologist, the patient should be a for 1.5 hours after procedure and can be discharged home if stays in normal sinus rhythm.  Recheck at 7:15 PM, still in sinus rhythm, stable for discharge, normal blood pressure.  Johnna Acosta, MD 09/08/13 [phone]

## 2013-09-08 NOTE — ED Notes (Signed)
Patient is resting comfortably. 

## 2013-09-08 NOTE — Progress Notes (Signed)
Instructions and follow-up appointment entered into discharge AVS.

## 2013-09-08 NOTE — ED Notes (Signed)
Patient states he noted his heartrate became irregular since last night.  He denies having chest pain but reports generalized weakness.  Patient has hx of same, seen by Dr Crissie Sickles. Patient states he has periods of feeling dizziness, near syncope as well.

## 2013-09-08 NOTE — ED Notes (Signed)
Introduced myself to the patient.  He is comfortable and resting. 

## 2013-09-08 NOTE — ED Notes (Signed)
CBG - 141 ° °

## 2013-09-08 NOTE — H&P (Signed)
CARDIOLOGY ADMISSION HISTORY & PHYSICAL   William Stein ID: William William Stein MRN: [id number], DOB/AGE: August 02, 1930   Date of Admission: 09/08/2013  Primary Physician: Tommy Medal, MD Primary Cardiologist / EP: Cristopher Peru, MD Reason for Admission: Atrial fibrillation  History of Present Illness William William Stein is a 80 year old man with paroxysmal AFib, maintained on flecainide, sinus bradycardia, HTN and dyslipidemia. His AFib has been well controlled with flecainide in William last 2 years. Recently he has been under a lot of stress due to working full time as night shift security guard, part time work as Optometrist and increased stress due to daughter and son in-law moving into his home. He doesn't get a lot of sleep. Last night, around 7 PM, he felt palpitations accompanied by weakness, fatigue and "hyper feeling" in his chest. These are his typical symptoms with AFib. He waited to see if his symptoms would improve through William night; however, they have persisted which prompted him to come to William ED. He denies CP or SOB. He denies dizziness, near syncope or syncope. He denies LE swelling, orthopnea or PND. He denies recent illness, fever / chills. He denies recent changes to his medications. He reports compliance, including diltiazem, flecainide and Pradaxa. [name], 12-lead ECG confirms atrial fibrillation.    Most recent cardiac studies - cardiac cath March 2009: mild ectasia of pLAD and pRCA, EF 60%, renals, iliacs, abdominal aorta all normal. Echo July 2013: EF 30-86%, grade I diastolic dysfunction, normal RV size and systolic function, no significant valvular abnormalities and mild to moderate LA dilation.  Past Medical History Past Medical History  Diagnosis Date  . Bradycardia   . Atrial fibrillation     a. On flecainide and pradaxa - ? compliance  . HTN (hypertension)   . HLD (hyperlipidemia)   . GERD (gastroesophageal reflux disease)   . Noncompliance     Past Surgical  History Past Surgical History  Procedure Laterality Date  . Doppler echocardiography  2011  . Exercise stress test  2012  . Colonoscopy  2003  . Cardiac catheterization  2009    Allergies/Intolerances No Known Allergies  Home Medications      cholecalciferol 1000 UNITS tablet  Commonly known as:  VITAMIN D  Take 1,000 Units by mouth daily.     diltiazem 180 MG 24 hr capsule  Commonly known as:  CARDIZEM CD  Take 180 mg by mouth daily.     esomeprazole 40 MG capsule  Commonly known as:  NEXIUM  Take 40 mg by mouth daily at 12 noon.     flecainide 100 MG tablet  Commonly known as:  TAMBOCOR  Take 100 mg by mouth 2 (two) times daily.     multivitamin tablet  Take 1 tablet by mouth daily.     PRADAXA 150 MG Caps capsule  Generic drug:  dabigatran  Take 150 mg by mouth 2 (two) times daily.     sodium chloride 0.65 % nasal spray  Commonly known as:  OCEAN  Place 2 sprays into William nose 2 (two) times daily.     vitamin E 400 UNIT capsule  Take 400 Units by mouth daily.     Family History Family History  Problem Relation Age of Onset  . Dementia Father 30  . Other Mother 43    old age  . Coronary artery disease Neg Hx      Social History History   Social History  . Marital Status: Widowed  Spouse Name: N/A    Number of Children: N/A  . Years of Education: N/A   Occupational History  . Full time [employer]   Social History Main Topics  . Smoking status: Never Smoker   . Smokeless tobacco: Not on file  . Alcohol Use: No  . Drug Use: No  . Sexual Activity: Not on file   Other Topics Concern  . Not on file   Social History Narrative   Widowed      Review of Systems General: No chills, fever, night sweats or weight changes.  Cardiovascular: No chest pain, dyspnea on exertion, edema, orthopnea, paroxysmal nocturnal dyspnea. Dermatological: No rash, lesions or masses. Respiratory: No cough, dyspnea. Urologic: No  hematuria, dysuria. Abdominal: No nausea, vomiting, diarrhea, bright red blood per rectum, melena, or hematemesis. Neurologic: No visual changes, changes in mental status. All other systems reviewed and are otherwise negative except as noted above.  Physical Exam Vitals: Blood pressure 104/49, pulse 100, temperature 97.7 F (36.5 C), temperature source Oral, resp. rate 18, height 5\' 8"  (1.727 m), weight 198 lb (89.812 kg), SpO2 97.00%.  General: Well developed, well appearing 80 y.o. male in no acute distress. HEENT: Normocephalic, atraumatic. EOMs intact. Sclera nonicteric. Oropharynx clear.  Neck: Supple. No JVD. Lungs: Respirations regular and unlabored, CTA bilaterally. No wheezes, rales or rhonchi. Heart: Irregular. S1, S2 present. No murmurs, rub, S3 or S4. Abdomen: Soft, non-tender, non-distended. BS present x 4 quadrants. No hepatosplenomegaly.  Extremities: No clubbing, cyanosis or edema. DP/PT/Radials 2+ and equal bilaterally. Psych: Normal affect. Neuro: Alert and oriented X 3. Moves all extremities spontaneously. Musculoskeletal: No kyphosis. Skin: Intact. Warm and dry. No rashes or petechiae in exposed areas.   Labs  Recent Labs  09/08/13 1238  TROPONINI <0.30   Lab Results  Component Value Date   WBC 9.3 09/08/2013   HGB 15.6 09/08/2013   HCT 46.0 09/08/2013   MCV 93.7 09/08/2013   PLT 345 09/08/2013    Recent Labs Lab 09/08/13 1238 09/08/13 1309  NA 136* 137  K 4.2 3.9  CL 99 99  CO2 21  --   BUN 16 16  CREATININE 1.16 1.30  CALCIUM 9.1  --   PROT 6.7  --   BILITOT 0.5  --   ALKPHOS 72  --   ALT 27  --   AST 24  --   GLUCOSE 153* 154*    Radiology/Studies Dg Chest Port 1 View  09/08/2013   CLINICAL DATA:  Weakness, shortness of Breath  EXAM: PORTABLE CHEST - 1 VIEW  COMPARISON:  11/06/2011  FINDINGS: Lungs are clear. Heart size and mediastinal contours are within normal limits. No effusion. Visualized skeletal structures are unremarkable.  IMPRESSION: No  acute cardiopulmonary disease.   Electronically Signed   By: Arne Cleveland M.D.   On: 09/08/2013 13:00   12-lead ECG today - atrial fibrillation with RVR, V rate 132 bpm; no ST-T wave abnormalities Telemetry - atrial fibrillation with V rate 75-100 bpm  Assessment and Plan Symptomatic atrial fibrillation William William Stein presents with symptomatic atrial fibrillation. He has been well controlled on flecainide. He reports compliance with his medications. Labs and chest x-ray unremarkable. Troponin negative and no anginal symptoms. Symptom onset / duration is less than 24 hours. Consider elective cardioversion to SR. If successful DCCV, then plan for DC home today.    Signed, Azzie Roup Edmisten, PA-C 09/08/2013, 3:28 PM  I have seen, examined William William Stein, and reviewed William above  assessment and plan.  Changes to above are made where necessary.  William William Stein has a h/o afib but has done very well with flecainide and diltiazem.  With recent life stressors he has developed recurrent afib which has been present since last night.  He reports compliance with twice daily pradaxa and his last dose was this am.  I would therefore advise cardioversion at this time. Risks, benefits, and alternatives to cardioversion were discussed at length with William William Stein who wishes to proceed. I will therefore proceed with cardioversion with William assistance of William ED physician staff for conscious sedation.  Once in sinus, we can hopefully discharge to home later today. No driving x 24 hours. Resume current home medicines at discharge.  Co Sign: Thompson Grayer, MD 09/08/2013 4:46 PM

## 2013-09-08 NOTE — ED Provider Notes (Signed)
Medical screening examination/treatment/procedure(s) were conducted as a shared visit with non-physician practitioner(s) and myself.  I personally evaluated the patient during the encounter.   EKG Interpretation   Date/Time:  Saturday Sep 08 2013 12:29:01 EDT Ventricular Rate:  132 PR Interval:    QRS Duration: 92 QT Interval:  284 QTC Calculation: 420 R Axis:   70 Text Interpretation:  Atrial fibrillation with rapid ventricular response  Nonspecific ST abnormality , probably digitalis effect Abnormal ECG  Confirmed by Vannary Greening  MD, Ericia Moxley ([id number]) on 09/08/2013 12:50:56 PM     Patient here with acute onset of heartbeat and weakness. History of A. fib. Started on Cardizem drip. Will be seen by cardiology.  Leota Jacobsen, MD 09/08/13 1515

## 2013-09-08 NOTE — ED Notes (Signed)
Patient is comfortable, free of pain and eating his heart healthy diet.

## 2013-09-10 LAB — URINE CULTURE: Colony Count: 60000

## 2013-09-12 NOTE — ED Provider Notes (Signed)
Medical screening examination/treatment/procedure(s) were conducted as a shared visit with non-physician practitioner(s) and myself.  I personally evaluated the patient during the encounter.   EKG Interpretation   Date/Time:  Saturday Sep 08 2013 12:29:01 EDT Ventricular Rate:  132 PR Interval:    QRS Duration: 92 QT Interval:  284 QTC Calculation: 420 R Axis:   70 Text Interpretation:  Atrial fibrillation with rapid ventricular response  Nonspecific ST abnormality , probably digitalis effect Abnormal ECG  Confirmed by Zenia Resides  MD, Jennice Renegar ([id number]) on 09/08/2013 12:50:56 PM       Leota Jacobsen, MD 09/12/13 2322

## 2013-09-14 DIAGNOSIS — I10 Essential (primary) hypertension: Secondary | ICD-10-CM | POA: Diagnosis not present

## 2013-09-20 DIAGNOSIS — M5380 Other specified dorsopathies, site unspecified: Secondary | ICD-10-CM | POA: Diagnosis not present

## 2013-09-20 DIAGNOSIS — R7309 Other abnormal glucose: Secondary | ICD-10-CM | POA: Diagnosis not present

## 2013-09-20 DIAGNOSIS — I4891 Unspecified atrial fibrillation: Secondary | ICD-10-CM | POA: Diagnosis not present

## 2013-10-02 ENCOUNTER — Ambulatory Visit (INDEPENDENT_AMBULATORY_CARE_PROVIDER_SITE_OTHER): Payer: PRIVATE HEALTH INSURANCE | Admitting: Cardiology

## 2013-10-02 ENCOUNTER — Encounter: Payer: Self-pay | Admitting: Cardiology

## 2013-10-02 VITALS — BP 120/68 | HR 64 | Wt 185.0 lb

## 2013-10-02 DIAGNOSIS — I4891 Unspecified atrial fibrillation: Secondary | ICD-10-CM

## 2013-10-02 NOTE — Progress Notes (Signed)
ELECTROPHYSIOLOGY OFFICE NOTE   Patient ID: William Stein MRN: [id number], DOB/AGE: 1930-07-24   Date of Visit: 10/02/2013  Primary Physician: Tommy Medal, MD Primary Cardiologist / EP: Cristopher Peru, MD Reason for Visit: Hospital follow-up  History of Present Illness  William Stein is a 80 y.o. man with paroxysmal AFib, maintained on flecainide, sinus bradycardia, HTN and dyslipidemia.   His AFib has been well controlled with flecainide in the last 2 years. Recently he has been under a lot of stress due to working full time as night shift security guard, part time work as Optometrist and increased stress due to daughter and son in-law moving into his home. He doesn't get a lot of sleep. On 09/08/2013 he felt palpitations accompanied by weakness, fatigue and "hyper feeling" in his chest. These are his typical symptoms with AFib. He waited to see if his symptoms would improve through the night; however, they persisted which prompted him to come to the ED. He denied CP or SOB. He denied dizziness, near syncope or syncope. He denied LE swelling, orthopnea or PND. He denied recent illness, fever / chills. He denied recent changes to his medications. He reported compliance, including diltiazem, flecainide and Pradaxa. In the ED, 12-lead ECG confirmed atrial fibrillation. Duration was less than 24 hours. He was evaluated by Dr. Rayann Heman and underwent DCCV which was successful for restoring SR. He was discharged from the ED to follow-up today. Today, William Stein reports he is doing well from a cardiac standpoint. He denies chest pain or shortness of breath. He denies palpitations, dizziness, near syncope or syncope. He denies LE swelling, orthopnea or PND. He is compliant and tolerating medications without difficulty.  Most recent cardiac studies - cardiac cath March 2009: mild ectasia of pLAD and pRCA, EF 60%, renals, iliacs, abdominal aorta all normal. Echo July 2013: EF 25-05%, grade I diastolic  dysfunction, normal RV size and systolic function, no significant valvular abnormalities and mild to moderate LA dilation.  Past Medical History Past Medical History  Diagnosis Date  . Bradycardia   . Atrial fibrillation     a. On flecainide and pradaxa - ? compliance  . HTN (hypertension)   . HLD (hyperlipidemia)   . GERD (gastroesophageal reflux disease)   . Noncompliance     Past Surgical History Past Surgical History  Procedure Laterality Date  . Doppler echocardiography  2011  . Exercise stress test  2012  . Colonoscopy  2003  . Cardiac catheterization  2009    Allergies/Intolerances No Known Allergies  Current Home Medications Current Outpatient Prescriptions  Medication Sig Dispense Refill  . Cholecalciferol (VITAMIN D-3) 5000 UNITS TABS Take 1 tablet by mouth daily.      . dabigatran (PRADAXA) 150 MG CAPS Take 150 mg by mouth 2 (two) times daily.        Marland Kitchen diltiazem (CARDIZEM CD) 180 MG 24 hr capsule Take 180 mg by mouth daily.      . flecainide (TAMBOCOR) 100 MG tablet Take 100 mg by mouth 2 (two) times daily.      . Multiple Vitamin (MULTIVITAMIN) tablet Take 1 tablet by mouth daily.        Marland Kitchen omeprazole (PRILOSEC) 20 MG capsule Take 20 mg by mouth daily.      . sodium chloride (OCEAN) 0.65 % nasal spray Place 2 sprays into the nose 2 (two) times daily.      . vitamin E 400 UNIT capsule Take 400 Units by mouth daily.      . [  DISCONTINUED] diltiazem (DILACOR XR) 180 MG 24 hr capsule Take 1 capsule (180 mg total) by mouth daily.  30 capsule  6   No current facility-administered medications for this visit.    Social History History   Social History  . Marital Status: Widowed    Spouse Name: N/A    Number of Children: N/A  . Years of Education: N/A   Occupational History  . Full time [employer]   Social History Main Topics  . Smoking status: Never Smoker   . Smokeless tobacco: Not on file  . Alcohol Use: No  . Drug Use: No  .  Sexual Activity: Not on file   Other Topics Concern  . Not on file   Social History Narrative   Widowed      Review of Systems General: No chills, fever, night sweats or weight changes Cardiovascular: No chest pain, dyspnea on exertion, edema, orthopnea, palpitations, paroxysmal nocturnal dyspnea Dermatological: No rash, lesions or masses Respiratory: No cough, dyspnea Urologic: No hematuria, dysuria Abdominal: No nausea, vomiting, diarrhea, bright red blood per rectum, melena, or hematemesis Neurologic: No visual changes, weakness, changes in mental status All other systems reviewed and are otherwise negative except as noted above.  Physical Exam Vitals: Blood pressure 120/68, pulse 64, weight 185 lb (83.915 kg).  General: Well developed, well appearing 80 y.o. male in no acute distress. HEENT: Normocephalic, atraumatic. EOMs intact. Sclera nonicteric. Oropharynx clear.  Neck: Supple. No JVD. Lungs: Respirations regular and unlabored, CTA bilaterally. No wheezes, rales or rhonchi. Heart: RRR. S1, S2 present. No murmurs, rub, S3 or S4. Abdomen: Soft, non-distended.  Extremities: No clubbing, cyanosis or edema. PT/Radials 2+ and equal bilaterally. Psych: Normal affect. Neuro: Alert and oriented X 3. Moves all extremities spontaneously.   Diagnostics 12-lead ECG today - NSR at 64 bpm with normal intervals; PR 206 QRS 92 QTc 435  Assessment and Plan Paroxysmal atrial fibrillation William Stein is doing well post DCCV. He is maintaining SR on flecainide. Continue flecainide 100 mg twice daily and diltiazem. Continue Pradaxa for stroke prevention. Return for follow-up with Dr. Lovena Le in 3 months.  Signed, Azzie Roup Saquan Furtick, PA-C 10/02/2013, 1:20 PM

## 2013-10-02 NOTE — Patient Instructions (Signed)
Your physician recommends that you schedule a follow-up appointment in:  3 months with Dr Taylor  Your physician recommends that you continue on your current medications as directed. Please refer to the Current Medication list given to you today.    

## 2013-10-05 ENCOUNTER — Other Ambulatory Visit: Payer: Self-pay | Admitting: Internal Medicine

## 2013-10-05 DIAGNOSIS — M545 Low back pain, unspecified: Secondary | ICD-10-CM

## 2013-10-05 DIAGNOSIS — E559 Vitamin D deficiency, unspecified: Secondary | ICD-10-CM | POA: Diagnosis not present

## 2013-10-13 ENCOUNTER — Ambulatory Visit
Admission: RE | Admit: 2013-10-13 | Discharge: 2013-10-13 | Disposition: A | Discharge status: Home / Self Care | Payer: PRIVATE HEALTH INSURANCE | Source: Ambulatory Visit | Attending: Internal Medicine | Admitting: Internal Medicine

## 2013-10-13 DIAGNOSIS — M5137 Other intervertebral disc degeneration, lumbosacral region: Secondary | ICD-10-CM | POA: Diagnosis not present

## 2013-10-13 DIAGNOSIS — M48061 Spinal stenosis, lumbar region without neurogenic claudication: Secondary | ICD-10-CM | POA: Diagnosis not present

## 2013-10-13 DIAGNOSIS — M47817 Spondylosis without myelopathy or radiculopathy, lumbosacral region: Secondary | ICD-10-CM | POA: Diagnosis not present

## 2013-10-13 DIAGNOSIS — M545 Low back pain, unspecified: Secondary | ICD-10-CM

## 2013-10-18 DIAGNOSIS — E559 Vitamin D deficiency, unspecified: Secondary | ICD-10-CM | POA: Diagnosis not present

## 2013-10-18 DIAGNOSIS — I4891 Unspecified atrial fibrillation: Secondary | ICD-10-CM | POA: Diagnosis not present

## 2013-10-19 DIAGNOSIS — M545 Low back pain, unspecified: Secondary | ICD-10-CM | POA: Diagnosis not present

## 2013-10-22 ENCOUNTER — Observation Stay (HOSPITAL_COMMUNITY)
Admission: EM | Admit: 2013-10-22 | Discharge: 2013-10-23 | Disposition: A | Discharge status: Home / Self Care | Payer: PRIVATE HEALTH INSURANCE | Attending: Internal Medicine | Admitting: Internal Medicine

## 2013-10-22 ENCOUNTER — Inpatient Hospital Stay (HOSPITAL_COMMUNITY): Payer: PRIVATE HEALTH INSURANCE

## 2013-10-22 ENCOUNTER — Encounter (HOSPITAL_COMMUNITY): Payer: Self-pay | Admitting: Emergency Medicine

## 2013-10-22 DIAGNOSIS — I10 Essential (primary) hypertension: Secondary | ICD-10-CM | POA: Diagnosis not present

## 2013-10-22 DIAGNOSIS — M1990 Unspecified osteoarthritis, unspecified site: Secondary | ICD-10-CM | POA: Diagnosis present

## 2013-10-22 DIAGNOSIS — I4891 Unspecified atrial fibrillation: Principal | ICD-10-CM | POA: Insufficient documentation

## 2013-10-22 DIAGNOSIS — K219 Gastro-esophageal reflux disease without esophagitis: Secondary | ICD-10-CM | POA: Insufficient documentation

## 2013-10-22 DIAGNOSIS — M549 Dorsalgia, unspecified: Secondary | ICD-10-CM | POA: Diagnosis not present

## 2013-10-22 DIAGNOSIS — R0600 Dyspnea, unspecified: Secondary | ICD-10-CM

## 2013-10-22 DIAGNOSIS — Z91199 Patient's noncompliance with other medical treatment and regimen due to unspecified reason: Secondary | ICD-10-CM

## 2013-10-22 DIAGNOSIS — I5189 Other ill-defined heart diseases: Secondary | ICD-10-CM

## 2013-10-22 DIAGNOSIS — I519 Heart disease, unspecified: Secondary | ICD-10-CM | POA: Diagnosis not present

## 2013-10-22 DIAGNOSIS — Z7901 Long term (current) use of anticoagulants: Secondary | ICD-10-CM | POA: Diagnosis not present

## 2013-10-22 DIAGNOSIS — I498 Other specified cardiac arrhythmias: Secondary | ICD-10-CM

## 2013-10-22 DIAGNOSIS — G8929 Other chronic pain: Secondary | ICD-10-CM | POA: Diagnosis not present

## 2013-10-22 DIAGNOSIS — M129 Arthropathy, unspecified: Secondary | ICD-10-CM | POA: Diagnosis not present

## 2013-10-22 DIAGNOSIS — Z9119 Patient's noncompliance with other medical treatment and regimen: Secondary | ICD-10-CM

## 2013-10-22 DIAGNOSIS — Z23 Encounter for immunization: Secondary | ICD-10-CM | POA: Insufficient documentation

## 2013-10-22 DIAGNOSIS — E785 Hyperlipidemia, unspecified: Secondary | ICD-10-CM | POA: Diagnosis not present

## 2013-10-22 LAB — CBC WITH DIFFERENTIAL/PLATELET
BASOS ABS: 0 10*3/uL (ref 0.0–0.1)
Basophils Relative: 0 % (ref 0–1)
EOS PCT: 2 % (ref 0–5)
Eosinophils Absolute: 0.2 10*3/uL (ref 0.0–0.7)
HEMATOCRIT: 37.4 % — AB (ref 39.0–52.0)
Hemoglobin: 13 g/dL (ref 13.0–17.0)
Lymphocytes Relative: 38 % (ref 12–46)
Lymphs Abs: 2.7 10*3/uL (ref 0.7–4.0)
MCH: 32.7 pg (ref 26.0–34.0)
MCHC: 34.8 g/dL (ref 30.0–36.0)
MCV: 94 fL (ref 78.0–100.0)
MONO ABS: 0.8 10*3/uL (ref 0.1–1.0)
Monocytes Relative: 11 % (ref 3–12)
Neutro Abs: 3.5 10*3/uL (ref 1.7–7.7)
Neutrophils Relative %: 49 % (ref 43–77)
PLATELETS: 198 10*3/uL (ref 150–400)
RBC: 3.98 MIL/uL — ABNORMAL LOW (ref 4.22–5.81)
RDW: 13.9 % (ref 11.5–15.5)
WBC: 7.2 10*3/uL (ref 4.0–10.5)

## 2013-10-22 LAB — BASIC METABOLIC PANEL
BUN: 27 mg/dL — ABNORMAL HIGH (ref 6–23)
CALCIUM: 9 mg/dL (ref 8.4–10.5)
CHLORIDE: 101 meq/L (ref 96–112)
CO2: 23 mEq/L (ref 19–32)
CREATININE: 1.08 mg/dL (ref 0.50–1.35)
GFR calc Af Amer: 73 mL/min — ABNORMAL LOW (ref >90)
GFR calc non Af Amer: 63 mL/min — ABNORMAL LOW (ref >90)
Glucose, Bld: 112 mg/dL — ABNORMAL HIGH (ref 70–99)
Potassium: 4 mEq/L (ref 3.7–5.3)
Sodium: 139 mEq/L (ref 137–147)

## 2013-10-22 LAB — I-STAT TROPONIN, ED: TROPONIN I, POC: 0.01 ng/mL (ref 0.00–0.08)

## 2013-10-22 LAB — PRO B NATRIURETIC PEPTIDE: Pro B Natriuretic peptide (BNP): 2367 pg/mL — ABNORMAL HIGH (ref 0–450)

## 2013-10-22 MED ORDER — DEXTROSE 5 % IV SOLN
10.0000 mg/h | INTRAVENOUS | Status: DC
  Administered 2013-10-22: 10 mg/h via INTRAVENOUS

## 2013-10-22 MED ORDER — DILTIAZEM LOAD VIA INFUSION
15.0000 mg | Freq: Once | INTRAVENOUS | Status: AC
  Administered 2013-10-22: 15 mg via INTRAVENOUS

## 2013-10-22 MED ORDER — DILTIAZEM HCL 25 MG/5ML IV SOLN
10.0000 mg | Freq: Once | INTRAVENOUS | Status: AC
Start: 2013-10-22 — End: 2013-10-22
  Administered 2013-10-22: 10 mg via INTRAVENOUS
  Filled 2013-10-22: qty 5

## 2013-10-22 NOTE — ED Provider Notes (Signed)
CSN: [id number]     Arrival date & time 10/22/13  1830 History   First MD Initiated Contact with Patient 10/22/13 1907     Chief Complaint  Patient presents with  . Atrial Fibrillation  . Tachycardia     (Consider location/radiation/quality/duration/timing/severity/associated sxs/prior Treatment) Patient is a 80 y.o. male presenting with palpitations.  Palpitations Palpitations quality:  Irregular Onset quality:  Sudden Duration:  1 day Timing:  Constant Progression:  Unchanged Chronicity:  Recurrent Context: caffeine   Context: not anxiety and not illicit drugs   Relieved by:  None tried Worsened by:  Nothing tried Ineffective treatments:  None tried Associated symptoms: no back pain, no chest pain, no cough, no diaphoresis, no dizziness, no lower extremity edema, no nausea, no numbness, no shortness of breath, no vomiting and no weakness     Past Medical History  Diagnosis Date  . Bradycardia   . Atrial fibrillation     a. On flecainide and pradaxa - ? compliance  . HTN (hypertension)   . HLD (hyperlipidemia)   . GERD (gastroesophageal reflux disease)   . Noncompliance    Past Surgical History  Procedure Laterality Date  . Doppler echocardiography  2011  . Exercise stress test  2012  . Colonoscopy  2003  . Cardiac catheterization  2009   Family History  Problem Relation Age of Onset  . Dementia Father 58  . Other Mother 15    old age  . Coronary artery disease Neg Hx    History  Substance Use Topics  . Smoking status: Never Smoker   . Smokeless tobacco: Not on file  . Alcohol Use: No    Review of Systems  Constitutional: Negative for fever, chills and diaphoresis.  HENT: Negative for congestion and rhinorrhea.   Eyes: Negative for pain.  Respiratory: Negative for cough and shortness of breath.   Cardiovascular: Positive for palpitations. Negative for chest pain.  Gastrointestinal: Negative for nausea, vomiting, abdominal pain, diarrhea and  constipation.  Endocrine: Negative for polydipsia and polyuria.  Genitourinary: Negative for dysuria and flank pain.  Musculoskeletal: Negative for back pain and neck pain.  Skin: Negative for color change and wound.  Neurological: Negative for dizziness, numbness and headaches.      Allergies  Review of patient's allergies indicates no known allergies.  Home Medications   Prior to Admission medications   Medication Sig Start Date End Date Taking? Authorizing Provider  Cholecalciferol (VITAMIN D-3) 1000 UNITS CAPS Take 1 capsule by mouth daily.   Yes Historical Provider, MD  dabigatran (PRADAXA) 150 MG CAPS Take 150 mg by mouth 2 (two) times daily.     Yes Historical Provider, MD  diltiazem (CARDIZEM CD) 180 MG 24 hr capsule Take 180 mg by mouth daily.   Yes Historical Provider, MD  esomeprazole (NEXIUM) 40 MG capsule Take 40 mg by mouth daily at 12 noon.   Yes Historical Provider, MD  flecainide (TAMBOCOR) 100 MG tablet Take 100 mg by mouth 2 (two) times daily.   Yes Historical Provider, MD  HYDROcodone-acetaminophen (NORCO/VICODIN) 5-325 MG per tablet Take 1 tablet by mouth every 6 (six) hours as needed for moderate pain.   Yes Historical Provider, MD  Multiple Vitamin (MULTIVITAMIN) tablet Take 1 tablet by mouth daily.     Yes Historical Provider, MD  sodium chloride (OCEAN) 0.65 % nasal spray Place 2 sprays into the nose 2 (two) times daily.   Yes Historical Provider, MD  vitamin E 400 UNIT capsule Take 400 Units  by mouth daily.   Yes Historical Provider, MD   BP 113/65  Pulse 104  Temp(Src) 98.3 F (36.8 C) (Oral)  Resp 15  SpO2 99% Physical Exam  Nursing note and vitals reviewed. Constitutional: He is oriented to person, place, and time. He appears well-developed and well-nourished.  HENT:  Head: Normocephalic and atraumatic.  Eyes: Conjunctivae and EOM are normal. Pupils are equal, round, and reactive to light.  Neck: Normal range of motion.  Cardiovascular: Normal rate  and regular rhythm.   Pulmonary/Chest: Effort normal and breath sounds normal.  Abdominal: Soft. He exhibits no distension. There is no tenderness.  Musculoskeletal: Normal range of motion. He exhibits no edema and no tenderness.  Neurological: He is alert and oriented to person, place, and time.  Skin: Skin is warm and dry.    ED Course  Procedures (including critical care time) Labs Review Labs Reviewed - No data to display  Imaging Review No results found.   EKG Interpretation   Date/Time:  Monday October 22 2013 18:47:48 EDT Ventricular Rate:  126 PR Interval:    QRS Duration: 100 QT Interval:  340 QTC Calculation: 492 R Axis:   54 Text Interpretation:  Atrial flutter with variable A-V block Abnormal  QRS-T angle, consider primary T wave abnormality Abnormal ECG Atrial  fibrillation versus flutter T wave abnormality Abnormal ekg Confirmed by  Carmin Muskrat  MD [phone]) on 10/22/2013 6:59:24 PM      MDM   Final diagnoses:  None    80 yo M here with palpitations similar to previous afib episodes. No cp, sob, dizziness or leg swelling initially 120's here, gave dilt bolus and initially converted to sinus rhythm but during obs period he went back into afib, still asymptomatic, without hypotension. Another bolus and infusion started, will get labs, cxr and admit for cardizem dose adjustment.    Merrily Pew, MD 10/22/13 [phone]

## 2013-10-22 NOTE — ED Notes (Signed)
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

## 2013-10-22 NOTE — ED Notes (Signed)
Pt back in afib, 90-124, EKG repeated, Dr. Dayna Barker aware, orders received for cardizem.

## 2013-10-22 NOTE — ED Notes (Signed)
Back from xray, no changes, denies pain, denies sx, alert, NAD, calm, cardizem continues to infuse.

## 2013-10-22 NOTE — ED Notes (Signed)
HR converted 102 ST, Dr. Dayna Barker at [facility], NAD, calm, interactive, alert.

## 2013-10-22 NOTE — ED Notes (Addendum)
Pt alert, NAD, calm, interactive, resps e/u, speaking in clear complete sentences, VSS, afib 100-122, (denies: CP, sob, nv, dizziness, blurred vision or other sx). Mentions chronic R buttocks sciatic pain.

## 2013-10-23 ENCOUNTER — Encounter (HOSPITAL_COMMUNITY): Payer: Self-pay | Admitting: Physician Assistant

## 2013-10-23 ENCOUNTER — Telehealth: Payer: Self-pay | Admitting: Internal Medicine

## 2013-10-23 DIAGNOSIS — I519 Heart disease, unspecified: Secondary | ICD-10-CM

## 2013-10-23 DIAGNOSIS — I4891 Unspecified atrial fibrillation: Principal | ICD-10-CM

## 2013-10-23 DIAGNOSIS — I10 Essential (primary) hypertension: Secondary | ICD-10-CM | POA: Diagnosis not present

## 2013-10-23 DIAGNOSIS — M1990 Unspecified osteoarthritis, unspecified site: Secondary | ICD-10-CM | POA: Diagnosis present

## 2013-10-23 LAB — COMPREHENSIVE METABOLIC PANEL
ALBUMIN: 3.3 g/dL — AB (ref 3.5–5.2)
ALT: 15 U/L (ref 0–53)
AST: 13 U/L (ref 0–37)
Alkaline Phosphatase: 63 U/L (ref 39–117)
BUN: 19 mg/dL (ref 6–23)
CO2: 26 mEq/L (ref 19–32)
Calcium: 8.9 mg/dL (ref 8.4–10.5)
Chloride: 105 mEq/L (ref 96–112)
Creatinine, Ser: 0.95 mg/dL (ref 0.50–1.35)
GFR calc Af Amer: 89 mL/min — ABNORMAL LOW (ref >90)
GFR calc non Af Amer: 77 mL/min — ABNORMAL LOW (ref >90)
Glucose, Bld: 124 mg/dL — ABNORMAL HIGH (ref 70–99)
POTASSIUM: 4.6 meq/L (ref 3.7–5.3)
SODIUM: 141 meq/L (ref 137–147)
Total Bilirubin: 0.6 mg/dL (ref 0.3–1.2)
Total Protein: 5.9 g/dL — ABNORMAL LOW (ref 6.0–8.3)

## 2013-10-23 LAB — CBC WITH DIFFERENTIAL/PLATELET
BASOS PCT: 0 % (ref 0–1)
Basophils Absolute: 0 10*3/uL (ref 0.0–0.1)
Eosinophils Absolute: 0.2 10*3/uL (ref 0.0–0.7)
Eosinophils Relative: 2 % (ref 0–5)
HCT: 39.4 % (ref 39.0–52.0)
Hemoglobin: 13.8 g/dL (ref 13.0–17.0)
Lymphocytes Relative: 39 % (ref 12–46)
Lymphs Abs: 3.1 10*3/uL (ref 0.7–4.0)
MCH: 33.3 pg (ref 26.0–34.0)
MCHC: 35 g/dL (ref 30.0–36.0)
MCV: 95.2 fL (ref 78.0–100.0)
Monocytes Absolute: 0.6 10*3/uL (ref 0.1–1.0)
Monocytes Relative: 8 % (ref 3–12)
NEUTROS ABS: 4 10*3/uL (ref 1.7–7.7)
NEUTROS PCT: 51 % (ref 43–77)
Platelets: 181 10*3/uL (ref 150–400)
RBC: 4.14 MIL/uL — ABNORMAL LOW (ref 4.22–5.81)
RDW: 13.9 % (ref 11.5–15.5)
WBC: 7.9 10*3/uL (ref 4.0–10.5)

## 2013-10-23 LAB — MAGNESIUM: MAGNESIUM: 2.3 mg/dL (ref 1.5–2.5)

## 2013-10-23 LAB — PROTIME-INR
INR: 1.09 (ref 0.00–1.49)
Prothrombin Time: 13.9 seconds (ref 11.6–15.2)

## 2013-10-23 LAB — TROPONIN I: Troponin I: <0.3 ng/mL (ref <0.30)

## 2013-10-23 MED ORDER — PNEUMOCOCCAL VAC POLYVALENT 25 MCG/0.5ML IJ INJ: 0.5000 mL | INJECTION | INTRAMUSCULAR | Status: DC

## 2013-10-23 MED ORDER — LIDOCAINE 5 % EX PTCH: 1.0000 | MEDICATED_PATCH | CUTANEOUS | Status: DC

## 2013-10-23 MED ORDER — DILTIAZEM HCL ER COATED BEADS 180 MG PO CP24
180.0000 mg | ORAL_CAPSULE | Freq: Every day | ORAL | Status: DC
  Administered 2013-10-23: 180 mg via ORAL
  Filled 2013-10-23: qty 1

## 2013-10-23 MED ORDER — DILTIAZEM HCL 100 MG IV SOLR
5.0000 mg/h | INTRAVENOUS | Status: DC
  Administered 2013-10-23: 10 mg/h via INTRAVENOUS
  Filled 2013-10-23 (×2): qty 100

## 2013-10-23 MED ORDER — HYDROCODONE-ACETAMINOPHEN 5-325 MG PO TABS
1.0000 | ORAL_TABLET | Freq: Four times a day (QID) | ORAL | Status: DC | PRN
  Administered 2013-10-23: 1 via ORAL
  Filled 2013-10-23: qty 1

## 2013-10-23 MED ORDER — PANTOPRAZOLE SODIUM 40 MG PO TBEC
40.0000 mg | DELAYED_RELEASE_TABLET | Freq: Every day | ORAL | Status: DC
  Administered 2013-10-23: 40 mg via ORAL
  Filled 2013-10-23: qty 1

## 2013-10-23 MED ORDER — DABIGATRAN ETEXILATE MESYLATE 150 MG PO CAPS
150.0000 mg | ORAL_CAPSULE | Freq: Two times a day (BID) | ORAL | Status: DC
  Administered 2013-10-23: 150 mg via ORAL
  Filled 2013-10-23 (×2): qty 1

## 2013-10-23 MED ORDER — DILTIAZEM HCL ER COATED BEADS 240 MG PO CP24: 240.0000 mg | ORAL_CAPSULE | Freq: Every day | ORAL | Status: DC

## 2013-10-23 MED ORDER — ACETAMINOPHEN 650 MG RE SUPP: 650.0000 mg | Freq: Four times a day (QID) | RECTAL | Status: DC | PRN

## 2013-10-23 MED ORDER — SODIUM CHLORIDE 0.9 % IJ SOLN: 3.0000 mL | Freq: Two times a day (BID) | INTRAMUSCULAR | Status: DC

## 2013-10-23 MED ORDER — PNEUMOCOCCAL VAC POLYVALENT 25 MCG/0.5ML IJ INJ
0.5000 mL | INJECTION | INTRAMUSCULAR | Status: AC
  Administered 2013-10-23: 0.5 mL via INTRAMUSCULAR
  Filled 2013-10-23: qty 0.5

## 2013-10-23 MED ORDER — FLECAINIDE ACETATE 100 MG PO TABS
100.0000 mg | ORAL_TABLET | Freq: Two times a day (BID) | ORAL | Status: DC
  Administered 2013-10-23: 100 mg via ORAL
  Filled 2013-10-23 (×3): qty 1

## 2013-10-23 MED ORDER — ACETAMINOPHEN 325 MG PO TABS: 650.0000 mg | ORAL_TABLET | Freq: Four times a day (QID) | ORAL | Status: DC | PRN

## 2013-10-23 MED ORDER — LIDOCAINE 5 % EX PTCH
1.0000 | MEDICATED_PATCH | CUTANEOUS | Status: DC
Start: 2013-10-23 — End: 2013-10-23
  Administered 2013-10-23: 1 via TRANSDERMAL
  Filled 2013-10-23: qty 1

## 2013-10-23 NOTE — Progress Notes (Signed)
IV and tele monitor d/c at this time; pt given d/c instructions; prescriptions given; pt do d/c home alone; no d/c needs; will cont. To monitor.

## 2013-10-23 NOTE — Consult Note (Signed)
CARDIOLOGY CONSULT NOTE   Patient ID: William Stein MRN: [id number] DOB/AGE: 1931-04-17 80 y.o.  Admit date: 10/22/2013  Primary Physician   Tommy Medal, MD Primary Cardiologist   Dr. Rayann Heman Reason for Consultation   Atrial fib, RVR  William Stein is a 80 y.o. male with a history of atrial fibrillation. He was in the ER on 05/09 with atrial fib and was cardioverted to sinus rhythm. He is compliant with Diltiazem, flecainide and Pradaxa. His EF is preserved. Last ischemic evaluation was in 2012, when he had a normal Lexiscan Myoview. Cath in March 2009 showed mild ectasia of pLAD and pRCA, EF 60%, renals, iliacs, abdominal aorta all normal.   Since discharged from the ER on 05/09, he has done well until 2 days ago. On 06/21, he developed palpitations and felt that he was in atrial fibrillation. On his home blood pressure machine, his blood pressure was within normal limits, but his heart rate ranged from 50-125 beats per minute. He denies chest pain, shortness of breath, or presyncope. He thought he would be able to wait it out. Yesterday, when his symptoms did not resolve, he came to the emergency room. He was admitted and started on IV Cardizem which was running at 10 mg per hour. At approximately 10:15 AM today, he spontaneously converted to sinus rhythm.  In sinus rhythm, he feels better. Of note, he feels that his job as a [employer] is great deal of physical stress on him. He struggles with the walking because of chronic back pain. He is being evaluated by orthopedics for this.  Currently, except for his back pain, he feels well.   Past Medical History  Diagnosis Date  . Bradycardia   . Atrial fibrillation 2007    a. On flecainide and pradaxa - pt says is compliant  . HTN (hypertension)   . HLD (hyperlipidemia)   . GERD (gastroesophageal reflux disease)   . Noncompliance      Past Surgical History  Procedure Laterality Date  . Doppler  echocardiography  2011  . Exercise stress test  2012  . Colonoscopy  2003  . Cardiac catheterization  2009    No Known Allergies  I have reviewed the patient's current medications . dabigatran  150 mg Oral Q12H  . diltiazem  180 mg Oral Daily  . flecainide  100 mg Oral BID  . lidocaine  1 patch Transdermal Q24H  . pantoprazole  40 mg Oral Daily  . [START ON 10/24/2013] pneumococcal 23 valent vaccine  0.5 mL Intramuscular Tomorrow-1000  . sodium chloride  3 mL Intravenous Q12H     acetaminophen, acetaminophen, HYDROcodone-acetaminophen  Prior to Admission medications   Medication Sig Start Date End Date Taking? Authorizing Provider  Cholecalciferol (VITAMIN D-3) 1000 UNITS CAPS Take 1 capsule by mouth daily.   Yes Historical Provider, MD  dabigatran (PRADAXA) 150 MG CAPS Take 150 mg by mouth 2 (two) times daily.     Yes Historical Provider, MD  diltiazem (CARDIZEM CD) 180 MG 24 hr capsule Take 180 mg by mouth daily.   Yes Historical Provider, MD  esomeprazole (NEXIUM) 40 MG capsule Take 40 mg by mouth daily at 12 noon.   Yes Historical Provider, MD  flecainide (TAMBOCOR) 100 MG tablet Take 100 mg by mouth 2 (two) times daily.   Yes Historical Provider, MD  HYDROcodone-acetaminophen (NORCO/VICODIN) 5-325 MG per tablet Take 1 tablet by mouth every 6 (six) hours as needed for moderate pain.  Yes Historical Provider, MD  Multiple Vitamin (MULTIVITAMIN) tablet Take 1 tablet by mouth daily.     Yes Historical Provider, MD  sodium chloride (OCEAN) 0.65 % nasal spray Place 2 sprays into the nose 2 (two) times daily.   Yes Historical Provider, MD  vitamin E 400 UNIT capsule Take 400 Units by mouth daily.   Yes Historical Provider, MD     History   Social History  . Marital Status: Widowed    Spouse Name: N/A    Number of Children: N/A  . Years of Education: N/A   Occupational History  . Full time [employer]   Social History Main Topics  . Smoking  status: Never Smoker   . Smokeless tobacco: Not on file  . Alcohol Use: No  . Drug Use: No  . Sexual Activity: Not on file   Other Topics Concern  . Not on file   Social History Narrative   Widowed     Family Status  Relation Status Death Age  . Mother Deceased   . Father Deceased    Family History  Problem Relation Age of Onset  . Dementia Father 82  . Other Mother 49    old age  . Coronary artery disease Neg Hx      ROS:  Full 14 point review of systems complete and found to be negative unless listed above.  Physical Exam: Blood pressure 113/59, pulse 70, temperature 98 F (36.7 C), temperature source Oral, resp. rate 20, height 5\' 8"  (1.727 m), weight 183 lb 6.8 oz (83.2 kg), SpO2 98.00%.  General: Well developed, well nourished, male in no acute distress Head: Eyes PERRLA, No xanthomas.   Normocephalic and atraumatic, oropharynx without edema or exudate. Dentition: Poor Lungs: Clear to auscultation bilaterally Heart: HRRR S1 S2, no rub/gallop, soft systolic murmur. pulses are 2+ all 4 extrem.   Neck: No carotid bruits. No lymphadenopathy.  JVD not elevated. Abdomen: Bowel sounds present, abdomen soft and non-tender without masses or hernias noted. Msk:  No spine or cva tenderness. No weakness, no joint deformities or effusions. Extremities: No clubbing or cyanosis. No edema.  Neuro: Alert and oriented X 3. No focal deficits noted. Psych:  Good affect, responds appropriately Skin: No rashes or lesions noted.  Labs:   Lab Results  Component Value Date   WBC 7.9 10/23/2013   HGB 13.8 10/23/2013   HCT 39.4 10/23/2013   MCV 95.2 10/23/2013   PLT 181 10/23/2013    Recent Labs  10/23/13 0635  INR 1.09     Recent Labs Lab 10/23/13 0635  NA 141  K 4.6  CL 105  CO2 26  BUN 19  CREATININE 0.95  CALCIUM 8.9  PROT 5.9*  BILITOT 0.6  ALKPHOS 63  ALT 15  AST 13  GLUCOSE 124*  ALBUMIN 3.3*   Magnesium  Date Value Ref Range Status  10/23/2013 2.3  1.5 - 2.5  mg/dL Final    Recent Labs  10/23/13 0120 10/23/13 0635  TROPONINI <0.30 <0.30    Recent Labs  10/22/13 2201  TROPIPOC 0.01   Pro B Natriuretic peptide (BNP)  Date/Time Value Ref Range Status  10/22/2013  7:50 PM 2367.0* 0 - 450 pg/mL Final  07/12/2007  5:21 AM 63.4   Final   Echo: 11/17/2011 - Left ventricle: The cavity size was normal. Wall thickness was normal. Systolic function was normal. The estimated ejection fraction was in the range of 55% to 60%. Wall motion was  normal; there were no regional wall motion abnormalities. Doppler parameters are consistent with abnormal left ventricular relaxation (grade 1 diastolic dysfunction). - Aortic valve: There was no stenosis. Trivial regurgitation. - Mitral valve: Trivial regurgitation. - Left atrium: The atrium was mildly to moderately dilated. - Right ventricle: The cavity size was normal. Systolic function was normal. - Pulmonary arteries: No complete TR doppler jet so unable to estimate PA systolic pressure. - Systemic veins: IVC measured 2.2 cm with normal respirophasic variation suggesting RA pressure 6-10 mmHg. Impressions: - Normal LV size and systolic function, EF 06-30%. Normal RV size and systolic function. No significant valvular abnormalities. Mild to moderate LA dilation.   ECG:  23-Oct-2013 00:04:01  Atrial flutter with predominant 2:1 AV block Borderline right axis deviation Low voltage, precordial leads Nonspecific repol abnormality, inferior leads Borderline prolonged QT interval Vent. rate 107 BPM PR interval * ms QRS duration 94 ms QT/QTc 364/486 ms P-R-T axes -1 81 4  Radiology:  Dg Chest 2 View 10/23/2013   CLINICAL DATA:  Atrial fibrillation. Shortness of breath. Back pain.  EXAM: CHEST  2 VIEW  COMPARISON:  09/08/2013 ; 07/09/2007  FINDINGS: The patient is rotated to the Right on today's radiograph, reducing diagnostic sensitivity and specificity. Heart size within normal limits. Thoracic  spondylosis.  The lungs appear clear.  IMPRESSION: 1. No acute findings. 2. Thoracic spondylosis.   Electronically Signed   By: Sherryl Barters M.D.   On: 10/23/2013 00:19    ASSESSMENT AND PLAN:   The patient was seen today by Dr. Percival Spanish, the patient evaluated and the data reviewed.  Principal Problem:   Atrial fibrillation with RVR - spontaneous conversion to sinus rhythm. At that time, he was on Cardizem at 10 mg per hour. Consider increasing his home dose from 180 mg daily to 240 mg daily. Spoke with Dr. Rayann Heman over the phone, he requests a flecainide level be sent. Will order this. We'll arrange a follow up appointment with Dr. Lovena Le. No further inpatient evaluation indicated.  Active Problems:   Essential hypertension, benign - says SBP generally 130s at home    Arthritis - will try lidocaine patch, consider sending home on this if it is helpful    Chronic anticoagulation -  patient is compliant with Pradaxa.    Bradycardia - in 2009, Mr. Plush was admitted with afib, RVR. He had been off all medications after the recent death of his wife. He was started on Cardizem and metoprolol, with plans to d/c on Cardizem CD 180 and metoprolol 12.5 TID. However, he became bradycardic, so the metoprolol was discontinued and the Cardizem was decreased. In 2010, he was on Bystolic and Flecainide 160 mg BID but continued to have sinus bradycardia in the 50s and complain of fatigue, despite decreasing the Bystolic to 2.5 mg daily.  He has tolerated Cardizem at 180 mg daily for over 2 years.    SignedRosaria Ferries, PA-C 10/23/2013 11:56 AM Murlean Hark [phone]  Co-Sign MD   History and all data above reviewed.  Patient examined.  I agree with the findings as above.  He has had paroxysms of atrial fib. He converted to NSR on IV Cardizem.  The patient exam reveals COR:RRR  ,  Lungs: Clear  ,  Abd: Positive bowel sounds, no rebound no guarding, Ext No edema  .  All available labs, radiology testing,  previous records reviewed. Agree with documented assessment and plan. Discussed with Dr. Rayann Heman.  We will check a flecainide level and I  will increase the Cardizem.  He will follow up with Dr. Rayann Heman to consider a higher dose of flecainide vs. Another medication.     Jeneen Rinks Hochrein  1:57 PM  10/23/2013

## 2013-10-23 NOTE — H&P (Signed)
[facility] History and Physical  Patient: William Stein  RXV:[id number]  DOB: [birth year]  DOS: the patient was seen and examined on 10/23/2013 PCP: Tommy Medal, MD  Chief Complaint: Palpitation  HPI: William Stein is a 80 y.o. male with Past medical history of atrial fibrillation, hypertension, dyslipidemia, GERD. Patient presented with complaints of palpitation. He has history of atrial fibrillation and he feels the palpitation was felt like the same. He mentions that he has on and off palpitation but that gets better on his own but this time it did not get better. Patient denies any complaint of fever, chills, chest pain, nausea, vomiting, cough, shortness of breath, orthopnea, PND, diarrhea, constipation. Patient mentions he is compliant with all his medication and takes them regularly. In the ED patient was given Cardizem bolus with which he converted to normal sinus rhythm and then reverted back to A. fib with RVR and hospitalist was asked for admission.  The patient is coming from home. And at his baseline independent for most of his ADL.  Review of Systems: as mentioned in the history of present illness.  A Comprehensive review of the other systems is negative.  Past Medical History  Diagnosis Date  . Bradycardia   . Atrial fibrillation     a. On flecainide and pradaxa - ? compliance  . HTN (hypertension)   . HLD (hyperlipidemia)   . GERD (gastroesophageal reflux disease)   . Noncompliance    Past Surgical History  Procedure Laterality Date  . Doppler echocardiography  2011  . Exercise stress test  2012  . Colonoscopy  2003  . Cardiac catheterization  2009   Social History:  reports that he has never smoked. He does not have any smokeless tobacco history on file. He reports that he does not drink alcohol or use illicit drugs.  No Known Allergies  Family History  Problem Relation Age of Onset  . Dementia Father 70  . Other Mother 77    old age  .  Coronary artery disease Neg Hx     Prior to Admission medications   Medication Sig Start Date End Date Taking? Authorizing Asyia Hornung  Cholecalciferol (VITAMIN D-3) 1000 UNITS CAPS Take 1 capsule by mouth daily.   Yes Historical Jamesmichael Shadd, MD  dabigatran (PRADAXA) 150 MG CAPS Take 150 mg by mouth 2 (two) times daily.     Yes Historical Mackenzye Mackel, MD  diltiazem (CARDIZEM CD) 180 MG 24 hr capsule Take 180 mg by mouth daily.   Yes Historical Naba Sneed, MD  esomeprazole (NEXIUM) 40 MG capsule Take 40 mg by mouth daily at 12 noon.   Yes Historical Deah Ottaway, MD  flecainide (TAMBOCOR) 100 MG tablet Take 100 mg by mouth 2 (two) times daily.   Yes Historical Desiderio Dolata, MD  HYDROcodone-acetaminophen (NORCO/VICODIN) 5-325 MG per tablet Take 1 tablet by mouth every 6 (six) hours as needed for moderate pain.   Yes Historical Clarie Camey, MD  Multiple Vitamin (MULTIVITAMIN) tablet Take 1 tablet by mouth daily.     Yes Historical Himani Corona, MD  sodium chloride (OCEAN) 0.65 % nasal spray Place 2 sprays into the nose 2 (two) times daily.   Yes Historical Braxson Hollingsworth, MD  vitamin E 400 UNIT capsule Take 400 Units by mouth daily.   Yes Historical Zaiya Annunziato, MD    Physical Exam: Filed Vitals:   10/22/13 2315 10/22/13 2326 10/22/13 2330 10/23/13 0049  BP: 95/72 117/61 129/88 125/64  Pulse: 54  61 99  Temp:    97.7 F (36.5 C)  TempSrc:    Oral  Resp: 13 18 21 20   Height:    5\' 8"  (1.727 m)  Weight:    83.2 kg (183 lb 6.8 oz)  SpO2: 100% 99% 99% 98%    General: Alert, Awake and Oriented to Time, Place and Person. Appear in mild distress Eyes: PERRL ENT: Oral Mucosa clear moist. Neck: no JVD Cardiovascular: S1 and S2 Present, no Murmur, Peripheral Pulses Present Respiratory: Bilateral Air entry equal and Decreased, Clear to Auscultation,  no Crackles,no wheezes Abdomen: Bowel Sound Present, Soft and Non tender Skin: no Rash Extremities: no Pedal edema, no calf tenderness Neurologic: Grossly no focal neuro  deficit.  Labs on Admission:  CBC:  Recent Labs Lab 10/22/13 1950  WBC 7.2  NEUTROABS 3.5  HGB 13.0  HCT 37.4*  MCV 94.0  PLT 198    CMP     Component Value Date/Time   NA 139 10/22/2013 1950   K 4.0 10/22/2013 1950   CL 101 10/22/2013 1950   CO2 23 10/22/2013 1950   GLUCOSE 112* 10/22/2013 1950   BUN 27* 10/22/2013 1950   CREATININE 1.08 10/22/2013 1950   CALCIUM 9.0 10/22/2013 1950   PROT 6.7 09/08/2013 1238   ALBUMIN 3.8 09/08/2013 1238   AST 24 09/08/2013 1238   ALT 27 09/08/2013 1238   ALKPHOS 72 09/08/2013 1238   BILITOT 0.5 09/08/2013 1238   GFRNONAA 63* 10/22/2013 1950   GFRAA 73* 10/22/2013 1950    No results found for this basename: LIPASE, AMYLASE,  in the last 168 hours No results found for this basename: AMMONIA,  in the last 168 hours  No results found for this basename: CKTOTAL, CKMB, CKMBINDEX, TROPONINI,  in the last 168 hours BNP (last 3 results)  Recent Labs  10/22/13 1950  PROBNP 2367.0*    Radiological Exams on Admission: Dg Chest 2 View  10/23/2013   CLINICAL DATA:  Atrial fibrillation. Shortness of breath. Back pain.  EXAM: CHEST  2 VIEW  COMPARISON:  09/08/2013 ; 07/09/2007  FINDINGS: The patient is rotated to the Right on today's radiograph, reducing diagnostic sensitivity and specificity. Heart size within normal limits. Thoracic spondylosis.  The lungs appear clear.  IMPRESSION: 1. No acute findings. 2. Thoracic spondylosis.   Electronically Signed   By: Sherryl Barters M.D.   On: 10/23/2013 00:19    EKG: Independently reviewed. atrial fibrillation, with RVR.  Assessment/Plan Principal Problem:   Atrial fibrillation with RVR Active Problems:   Essential hypertension, benign   Arthritis   1. Atrial fibrillation with RVR Patient presented with complaints of palpitation and was found to have A. fib with RVR. He require Cardizem drip to maintain rate controlled. At present chest x-ray is clear and does not appear to have any significant trend in  balance. Doesn't appear to have any ACS. We will follow serial troponin and continue him on Cardizem. Also continue him on flecainide. He may require cardiology consultation later on. At present we'll continue to monitor him on telemetry. I will keep him n.p.o. for possible need for cardioversion as he has gone through multiple cardioversions in the past as well.  2. Hypertension Currently on Cardizem.  3. Arthritis Home Norco continue  DVT Prophylaxis:on chronic anticoagulation Nutrition: N.p.o.  Code Status: Full  Disposition: Admitted to inpatient in telemetry unit.  Author: Berle Mull, MD [facility] Pager: [phone] 10/23/2013, 2:13 AM    If 7PM-7AM, please contact night-coverage [email or web address] Password [email or web address]

## 2013-10-23 NOTE — Discharge Summary (Signed)
Physician Discharge Summary  William Stein HER:[id number] DOB: 1930/09/23 DOA: 10/22/2013  PCP: Tommy Medal, MD  Admit date: 10/22/2013 Discharge date: 10/23/2013  Time spent: 35 minutes  Recommendations for Outpatient Follow-up:  1. Cardiology follow up  Discharge Diagnoses:  Principal Problem:   Atrial fibrillation with RVR Active Problems:   Essential hypertension, benign   Arthritis   Discharge Condition: improved  Diet recommendation: cardiac  Filed Weights   10/23/13 0049  Weight: 83.2 kg (183 lb 6.8 oz)    History of present illness:  William Stein is a 80 y.o. male with Past medical history of atrial fibrillation, hypertension, dyslipidemia, GERD.  Patient presented with complaints of palpitation. He has history of atrial fibrillation and he feels the palpitation was felt like the same. He mentions that he has on and off palpitation but that gets better on his own but this time it did not get better. Patient denies any complaint of fever, chills, chest pain, nausea, vomiting, cough, shortness of breath, orthopnea, PND, diarrhea, constipation. Patient mentions he is compliant with all his medication and takes them regularly.  In the ED patient was given Cardizem bolus with which he converted to normal sinus rhythm and then reverted back to A. fib with RVR and hospitalist was asked for admission.  The patient is coming from home. And at his baseline independent for most of his ADL.   Hospital Course:  Atrial fibrillation with RVR - spontaneous converted to sinus rhythm. A increase his home dose from 180 mg daily to 240 mg daily.  - flecainide level be sent- cards to follow in office.   Essential hypertension, benign Arthritis - will try lidocaine patch Chronic anticoagulation - patient is compliant with Pradaxa.   Procedures:  none  Consultations:  none  Discharge Exam: Filed Vitals:   10/23/13 0321  BP: 113/59  Pulse: 70  Temp: 98 F (36.7 C)   Resp: 20    General: A+Ox, NAD Cardiovascular: rrr Respiratory: clear  Discharge Instructions You were cared for by a hospitalist during your hospital stay. If you have any questions about your discharge medications or the care you received while you were in the hospital after you are discharged, you can call the unit and asked to speak with the hospitalist on call if the hospitalist that took care of you is not available. Once you are discharged, your primary care physician will handle any further medical issues. Please note that NO REFILLS for any discharge medications will be authorized once you are discharged, as it is imperative that you return to your primary care physician (or establish a relationship with a primary care physician if you do not have one) for your aftercare needs so that they can reassess your need for medications and monitor your lab values.  Discharge Instructions   Diet - low sodium heart healthy    Complete by:  As directed      Increase activity slowly    Complete by:  As directed             Medication List         diltiazem 240 MG 24 hr capsule  Commonly known as:  CARDIZEM CD  Take 1 capsule (240 mg total) by mouth daily.     esomeprazole 40 MG capsule  Commonly known as:  NEXIUM  Take 40 mg by mouth daily at 12 noon.     flecainide 100 MG tablet  Commonly known as:  TAMBOCOR  Take 100 mg  by mouth 2 (two) times daily.     HYDROcodone-acetaminophen 5-325 MG per tablet  Commonly known as:  NORCO/VICODIN  Take 1 tablet by mouth every 6 (six) hours as needed for moderate pain.     lidocaine 5 %  Commonly known as:  LIDODERM  Place 1 patch onto the skin daily. Remove & Discard patch within 12 hours or as directed by MD     multivitamin tablet  Take 1 tablet by mouth daily.     pneumococcal 23 valent vaccine 25 MCG/0.5ML injection  Commonly known as:  PNU-IMMUNE  Inject 0.5 mLs into the muscle tomorrow at 10 am.  Start taking on:  10/24/2013      PRADAXA 150 MG Caps capsule  Generic drug:  dabigatran  Take 150 mg by mouth 2 (two) times daily.     sodium chloride 0.65 % nasal spray  Commonly known as:  OCEAN  Place 2 sprays into the nose 2 (two) times daily.     Vitamin D-3 1000 UNITS Caps  Take 1 capsule by mouth daily.     vitamin E 400 UNIT capsule  Take 400 Units by mouth daily.       No Known Allergies     Follow-up Information   Follow up with Cristopher Peru, MD. (The office will call.)    Specialty:  Cardiology   Contact information:   [address] [address] [phone]       Follow up with Tommy Medal, MD In 2 weeks.   Specialty:  Internal Medicine   Contact information:   [address], [place] [address] [phone]        The results of significant diagnostics from this hospitalization (including imaging, microbiology, ancillary and laboratory) are listed below for reference.    Significant Diagnostic Studies: Dg Chest 2 View  10/23/2013   CLINICAL DATA:  Atrial fibrillation. Shortness of breath. Back pain.  EXAM: CHEST  2 VIEW  COMPARISON:  09/08/2013 ; 07/09/2007  FINDINGS: The patient is rotated to the Right on today's radiograph, reducing diagnostic sensitivity and specificity. Heart size within normal limits. Thoracic spondylosis.  The lungs appear clear.  IMPRESSION: 1. No acute findings. 2. Thoracic spondylosis.   Electronically Signed   By: Sherryl Barters M.D.   On: 10/23/2013 00:19   Mr Lumbar Spine Wo Contrast  10/13/2013   CLINICAL DATA:  Low back pain and right buttock pain for 6 months.  EXAM: MRI LUMBAR SPINE WITHOUT CONTRAST  TECHNIQUE: Multiplanar, multisequence MR imaging of the lumbar spine was performed. No intravenous contrast was administered.  COMPARISON:  Lumbar spine radiographs 09/20/2013  FINDINGS: There is grade 1 retrolisthesis of T12 on L1, L1 on L2, and L2 on L3. There is slight anterolisthesis of L5 on S1. There is  mild anterior wedging of the T11 and T12 vertebral bodies which appears chronic. Moderate to advanced disc space narrowing is present from T10-11 to L1-2. Prominent degenerative marrow changes are noted at L1-2. Moderate disc space narrowing is present from L2-3 to L4-5, with moderate to advanced disc space narrowing and mild degenerative marrow changes at L5-S1. The conus medullaris is normal in signal and terminates at the inferior aspect of L1. T2 hyperintense lesions in the right kidney most likely represents cysts.  T11-12: Only imaged sagittally. Minimal disc bulge without evidence of stenosis.  T12-L1: Listhesis with partial uncovering of the disc. No stenosis.  L1-2: Listhesis, endplate spurring, and facet hypertrophy mildly narrow  the lateral recesses and neural foramina bilaterally. No spinal stenosis.  L2-3: Listhesis, disc bulge, and facet and ligamentum flavum hypertrophy result in left greater than right lateral recess stenosis, mild spinal stenosis, and mild bilateral neural foraminal stenosis.  L3-4: Listhesis, disc bulge, and facet ligamentum flavum hypertrophy result in mild right greater than left lateral recess stenosis, mild spinal stenosis, and mild bilateral neural foraminal stenosis.  L4-5: Disc bulge, endplate spurring, and right greater than left facet and ligamentum flavum hypertrophy result in mild-to-moderate bilateral lateral recess stenosis, mild spinal stenosis, and mild-to-moderate right neural foraminal stenosis.  L5-S1: Listhesis with uncovering of the disc and advanced facet hypertrophy result in mild spinal stenosis and mild-to-moderate right and moderate left neural foraminal stenosis.  IMPRESSION: Advanced multilevel disc degeneration and facet arthrosis with mild spinal stenosis at L3-4, L4-5, and L5-S1. Neural foraminal stenosis is greatest at L5-S1 on the left, where it is moderate.   Electronically Signed   By: Logan Bores   On: 10/13/2013 14:08    Microbiology: No  results found for this or any previous visit (from the past 240 hour(s)).   Labs: Basic Metabolic Panel:  Recent Labs Lab 10/22/13 1950 10/23/13 0120 10/23/13 0635  NA 139  --  141  K 4.0  --  4.6  CL 101  --  105  CO2 23  --  26  GLUCOSE 112*  --  124*  BUN 27*  --  19  CREATININE 1.08  --  0.95  CALCIUM 9.0  --  8.9  MG  --  2.3  --    Liver Function Tests:  Recent Labs Lab 10/23/13 0635  AST 13  ALT 15  ALKPHOS 63  BILITOT 0.6  PROT 5.9*  ALBUMIN 3.3*   No results found for this basename: LIPASE, AMYLASE,  in the last 168 hours No results found for this basename: AMMONIA,  in the last 168 hours CBC:  Recent Labs Lab 10/22/13 1950 10/23/13 0635  WBC 7.2 7.9  NEUTROABS 3.5 4.0  HGB 13.0 13.8  HCT 37.4* 39.4  MCV 94.0 95.2  PLT 198 181   Cardiac Enzymes:  Recent Labs Lab 10/23/13 0120 10/23/13 0635  TROPONINI <0.30 <0.30   BNP: BNP (last 3 results)  Recent Labs  10/22/13 1950  PROBNP 2367.0*   CBG: No results found for this basename: GLUCAP,  in the last 168 hours     Signed:  Eulogio Bear  [facility] 10/23/2013, 12:28 PM

## 2013-10-23 NOTE — Progress Notes (Signed)
HR SR 62 at this time; Dr. Eliseo Squires paged to make aware; will await callback.

## 2013-10-23 NOTE — ED Provider Notes (Signed)
This patient was seen in conjunction with the resident physician, Dr. Dayna Barker.  The documentation accurately reflects the patient's ED evaluation.  On my evaluation, the patient was awake and alert, but with notable arrhythmia. Patient does have history of prior A. fib, previously requiring electrical cardioversion. Today the patient has no hypotension, but with persistent arrhythmia, received several attempts at conversion, but with diltiazem boluses, and eventually required initiation of a diltiazem drip given the lack of effect with boluses, fluids.  I have seen the ECG, agree with the interpretation.  On re-exam the patient is in no distress.  He continues to have HR 110's and greater.   CRITICAL CARE Performed by: Carmin Muskrat Total critical care time: 30 Critical care time was exclusive of separately billable procedures and treating other patients. Critical care was necessary to treat or prevent imminent or life-threatening deterioration. Critical care was time spent personally by me on the following activities: development of treatment plan with patient and/or surrogate as well as nursing, discussions with consultants, evaluation of patient's response to treatment, examination of patient, obtaining history from patient or surrogate, ordering and performing treatments and interventions, ordering and review of laboratory studies, ordering and review of radiographic studies, pulse oximetry and re-evaluation of patient's condition.    Carmin Muskrat, MD 10/23/13 [phone]

## 2013-10-23 NOTE — ED Notes (Signed)
Admitting MD at [facility] at time of leaving ED, no changes.

## 2013-10-23 NOTE — Progress Notes (Signed)
ANTICOAGULATION CONSULT NOTE - Initial Consult  Pharmacy Consult for pradaxa Indication: atrial fibrillation  No Known Allergies  Patient Measurements: Height: 5\' 8"  (172.7 cm) Weight: 183 lb 6.8 oz (83.2 kg) IBW/kg (Calculated) : 68.4   Vital Signs: Temp: 98 F (36.7 C) (06/23 0321) Temp src: Oral (06/23 0321) BP: 113/59 mmHg (06/23 0321) Pulse Rate: 70 (06/23 0321)  Labs:  Recent Labs  10/22/13 1950 10/23/13 0120 10/23/13 0635  HGB 13.0  --  13.8  HCT 37.4*  --  39.4  PLT 198  --  181  LABPROT  --   --  13.9  INR  --   --  1.09  CREATININE 1.08  --   --   TROPONINI  --  <0.30  --     Estimated Creatinine Clearance: 57.3 ml/min (by C-G formula based on Cr of 1.08).   Medical History: Past Medical History  Diagnosis Date  . Bradycardia   . Atrial fibrillation     a. On flecainide and pradaxa - ? compliance  . HTN (hypertension)   . HLD (hyperlipidemia)   . GERD (gastroesophageal reflux disease)   . Noncompliance     Medications:  Prescriptions prior to admission  Medication Sig Dispense Refill  . Cholecalciferol (VITAMIN D-3) 1000 UNITS CAPS Take 1 capsule by mouth daily.      . dabigatran (PRADAXA) 150 MG CAPS Take 150 mg by mouth 2 (two) times daily.        Marland Kitchen diltiazem (CARDIZEM CD) 180 MG 24 hr capsule Take 180 mg by mouth daily.      Marland Kitchen esomeprazole (NEXIUM) 40 MG capsule Take 40 mg by mouth daily at 12 noon.      . flecainide (TAMBOCOR) 100 MG tablet Take 100 mg by mouth 2 (two) times daily.      Marland Kitchen HYDROcodone-acetaminophen (NORCO/VICODIN) 5-325 MG per tablet Take 1 tablet by mouth every 6 (six) hours as needed for moderate pain.      . Multiple Vitamin (MULTIVITAMIN) tablet Take 1 tablet by mouth daily.        . sodium chloride (OCEAN) 0.65 % nasal spray Place 2 sprays into the nose 2 (two) times daily.      . vitamin E 400 UNIT capsule Take 400 Units by mouth daily.        Assessment: 80 yo man to resume pradaxa for afib.  CrCl >50 ml/min.  Labs  as above Goal of Therapy:  Monitor platelets by anticoagulation protocol: Yes   Plan:  Pradaxa 150 mg po bid. Monitor for bleeding  Hayli Milligan Poteet 10/23/2013,7:15 AM

## 2013-10-23 NOTE — Progress Notes (Signed)
Patient admitted after midnight.  Please see H&P.  Patient converted back to sinus about 10:30 AM.  Cards consult had already been called.  D/c gtt and resume PO Cardizem  William Bear DO

## 2013-10-23 NOTE — Telephone Encounter (Signed)
Will give to [facility]

## 2013-10-23 NOTE — Telephone Encounter (Signed)
New message   Rosaria Ferries PA discharging patient from hospital today. Need an sooner appt with Dr. Lovena Le first available not until August.

## 2013-10-27 LAB — FLECAINIDE LEVEL: Flecainide: 0.42 ug/mL (ref 0.20–1.00)

## 2013-11-13 ENCOUNTER — Encounter: Payer: Self-pay | Admitting: Internal Medicine

## 2013-11-13 ENCOUNTER — Ambulatory Visit (INDEPENDENT_AMBULATORY_CARE_PROVIDER_SITE_OTHER): Payer: PRIVATE HEALTH INSURANCE | Admitting: Internal Medicine

## 2013-11-13 ENCOUNTER — Other Ambulatory Visit: Payer: Self-pay

## 2013-11-13 ENCOUNTER — Encounter (INDEPENDENT_AMBULATORY_CARE_PROVIDER_SITE_OTHER): Payer: Self-pay

## 2013-11-13 VITALS — BP 123/60 | HR 59 | Ht 67.0 in | Wt 187.0 lb

## 2013-11-13 DIAGNOSIS — I10 Essential (primary) hypertension: Secondary | ICD-10-CM

## 2013-11-13 DIAGNOSIS — I4891 Unspecified atrial fibrillation: Secondary | ICD-10-CM

## 2013-11-13 MED ORDER — DILTIAZEM HCL ER COATED BEADS 240 MG PO CP24: 240.0000 mg | ORAL_CAPSULE | Freq: Every day | ORAL | Status: DC

## 2013-11-13 NOTE — Assessment & Plan Note (Signed)
He will continue his current meds. I have given him instruction to take an extra flecainide if he has recurrent atrial fib.

## 2013-11-13 NOTE — Patient Instructions (Signed)
Your physician wants you to follow-up in: 6 months with Dr Taylor You will receive a reminder letter in the mail two months in advance. If you don't receive a letter, please call our office to schedule the follow-up appointment.  

## 2013-11-13 NOTE — Progress Notes (Signed)
HPI Mr. Lutes returns today for followup. He is a very pleasant 80 year old man with a history of atrial fibrillation, sinus bradycardia, and hypertension. He has insomnia. He works the night shift. The patient has had no symptomatic atrial fibrillation in the last year until presenting several weeks ago with atrial fib. He has gone back to NSR.  He continues taking flecainide in conjunction with anticoagulation and diltiazem. No syncope. He states that his blood pressure has been fairly well-controlled. 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 No weight changes. He had been under excessive stress but this is resolved. No Known Allergies   Current Outpatient Prescriptions  Medication Sig Dispense Refill  . Ascorbic Acid (VITAMIN C) 1000 MG tablet Take 1,000 mg by mouth daily.      . Cholecalciferol (VITAMIN D-3) 1000 UNITS CAPS Take 1 capsule by mouth daily.      . dabigatran (PRADAXA) 150 MG CAPS Take 150 mg by mouth 2 (two) times daily.        Marland Kitchen esomeprazole (NEXIUM) 40 MG capsule Take 40 mg by mouth daily at 12 noon.      . flecainide (TAMBOCOR) 100 MG tablet Take 100 mg by mouth 2 (two) times daily.      Marland Kitchen HYDROcodone-acetaminophen (NORCO/VICODIN) 5-325 MG per tablet Take 1 tablet by mouth every 6 (six) hours as needed for moderate pain.      Marland Kitchen lidocaine (LIDODERM) 5 % Place 1 patch onto the skin daily. Remove & Discard patch within 12 hours or as directed by MD  30 patch  0  . Multiple Vitamin (MULTIVITAMIN) tablet Take 1 tablet by mouth daily.        . sodium chloride (OCEAN) 0.65 % nasal spray Place 2 sprays into the nose 2 (two) times daily.      . vitamin E 400 UNIT capsule Take 400 Units by mouth daily.      Marland Kitchen diltiazem (CARDIZEM CD) 240 MG 24 hr capsule Take 1 capsule (240 mg total) by mouth daily.  30 capsule  6  . [DISCONTINUED] diltiazem (DILACOR XR) 180 MG 24 hr capsule Take 1 capsule (180 mg total) by mouth daily.  30 capsule  6   No current  facility-administered medications for this visit.     Past Medical History  Diagnosis Date  . Bradycardia   . Atrial fibrillation 2007    a. On flecainide and pradaxa - pt says is compliant  . HTN (hypertension)   . HLD (hyperlipidemia)   . GERD (gastroesophageal reflux disease)   . Noncompliance     ROS:   All systems reviewed and negative except as noted in the HPI.   Past Surgical History  Procedure Laterality Date  . Doppler echocardiography  2011  . Exercise stress test  2012  . Colonoscopy  2003  . Cardiac catheterization  2009     Family History  Problem Relation Age of Onset  . Dementia Father 46  . Other Mother 37    old age  . Coronary artery disease Neg Hx      History   Social History  . Marital Status: Widowed    Spouse Name: N/A    Number of Children: N/A  . Years of Education: N/A   Occupational History  . Full time [employer]   Social History Main Topics  . Smoking status: Never Smoker   . Smokeless tobacco: Not on file  . Alcohol Use: No  .  Drug Use: No  . Sexual Activity: Not on file   Other Topics Concern  . Not on file   Social History Narrative   Widowed      BP 123/60  Pulse 59  Ht 5\' 7"  (1.702 m)  Wt 187 lb (84.823 kg)  BMI 29.28 kg/m2  Physical Exam:  Well appearing 80 year old man, NAD HEENT: Unremarkable Neck:  No JVD, no thyromegally Lungs:  Clear with no wheezes, rales, or rhonchi. HEART:  Regular rate rhythm, no murmurs, no rubs, no clicks Abd:  soft, positive bowel sounds, no organomegally, no rebound, no guarding Ext:  2 plus pulses, no edema, no cyanosis, no clubbing Skin:  No rashes no nodules Neuro:  CN II through XII intact, motor grossly intact  EKG Normal sinus rhythm.  Assess/Plan:

## 2013-11-13 NOTE — Assessment & Plan Note (Signed)
His pressure is ok. He will continue his current meds.

## 2013-11-15 DIAGNOSIS — M545 Low back pain, unspecified: Secondary | ICD-10-CM | POA: Diagnosis not present

## 2013-11-22 ENCOUNTER — Other Ambulatory Visit: Payer: Self-pay | Admitting: *Deleted

## 2013-11-22 MED ORDER — DILTIAZEM HCL ER COATED BEADS 240 MG PO CP24: 240.0000 mg | ORAL_CAPSULE | Freq: Every day | ORAL | Status: DC

## 2013-11-22 NOTE — Telephone Encounter (Signed)
Pt called wanting Diltiazem 240 QD to be refilled to Pharmacy. Pt aware refill was sent

## 2013-12-11 DIAGNOSIS — Z634 Disappearance and death of family member: Secondary | ICD-10-CM | POA: Diagnosis not present

## 2013-12-11 DIAGNOSIS — M549 Dorsalgia, unspecified: Secondary | ICD-10-CM | POA: Diagnosis not present

## 2013-12-16 ENCOUNTER — Other Ambulatory Visit: Payer: Self-pay | Admitting: Internal Medicine

## 2013-12-17 ENCOUNTER — Observation Stay (HOSPITAL_COMMUNITY)
Admission: EM | Admit: 2013-12-17 | Discharge: 2013-12-18 | Disposition: A | Discharge status: Home / Self Care | Payer: PRIVATE HEALTH INSURANCE | Attending: Cardiovascular Disease | Admitting: Cardiovascular Disease

## 2013-12-17 ENCOUNTER — Emergency Department (HOSPITAL_COMMUNITY): Payer: PRIVATE HEALTH INSURANCE

## 2013-12-17 ENCOUNTER — Encounter (HOSPITAL_COMMUNITY): Payer: Self-pay | Admitting: Emergency Medicine

## 2013-12-17 DIAGNOSIS — K219 Gastro-esophageal reflux disease without esophagitis: Secondary | ICD-10-CM | POA: Diagnosis not present

## 2013-12-17 DIAGNOSIS — I10 Essential (primary) hypertension: Secondary | ICD-10-CM | POA: Diagnosis not present

## 2013-12-17 DIAGNOSIS — I129 Hypertensive chronic kidney disease with stage 1 through stage 4 chronic kidney disease, or unspecified chronic kidney disease: Secondary | ICD-10-CM | POA: Insufficient documentation

## 2013-12-17 DIAGNOSIS — I4891 Unspecified atrial fibrillation: Secondary | ICD-10-CM | POA: Insufficient documentation

## 2013-12-17 DIAGNOSIS — N182 Chronic kidney disease, stage 2 (mild): Secondary | ICD-10-CM | POA: Insufficient documentation

## 2013-12-17 DIAGNOSIS — I4892 Unspecified atrial flutter: Secondary | ICD-10-CM | POA: Diagnosis not present

## 2013-12-17 DIAGNOSIS — E785 Hyperlipidemia, unspecified: Secondary | ICD-10-CM | POA: Diagnosis not present

## 2013-12-17 DIAGNOSIS — Z91199 Patient's noncompliance with other medical treatment and regimen due to unspecified reason: Secondary | ICD-10-CM | POA: Insufficient documentation

## 2013-12-17 DIAGNOSIS — Z9119 Patient's noncompliance with other medical treatment and regimen: Secondary | ICD-10-CM | POA: Diagnosis not present

## 2013-12-17 DIAGNOSIS — Z79899 Other long term (current) drug therapy: Secondary | ICD-10-CM | POA: Diagnosis not present

## 2013-12-17 DIAGNOSIS — I480 Paroxysmal atrial fibrillation: Secondary | ICD-10-CM | POA: Diagnosis present

## 2013-12-17 HISTORY — DX: Paroxysmal atrial fibrillation: I48.0

## 2013-12-17 HISTORY — DX: Chronic kidney disease, stage 2 (mild): N18.2

## 2013-12-17 LAB — TROPONIN I: Troponin I: <0.3 ng/mL (ref <0.30)

## 2013-12-17 LAB — CBC WITH DIFFERENTIAL/PLATELET
BASOS ABS: 0 10*3/uL (ref 0.0–0.1)
Basophils Relative: 0 % (ref 0–1)
EOS ABS: 0.2 10*3/uL (ref 0.0–0.7)
EOS PCT: 2 % (ref 0–5)
HCT: 38.9 % — ABNORMAL LOW (ref 39.0–52.0)
Hemoglobin: 13.6 g/dL (ref 13.0–17.0)
Lymphocytes Relative: 42 % (ref 12–46)
Lymphs Abs: 3.5 10*3/uL (ref 0.7–4.0)
MCH: 32.9 pg (ref 26.0–34.0)
MCHC: 35 g/dL (ref 30.0–36.0)
MCV: 94.2 fL (ref 78.0–100.0)
Monocytes Absolute: 0.6 10*3/uL (ref 0.1–1.0)
Monocytes Relative: 7 % (ref 3–12)
NEUTROS PCT: 49 % (ref 43–77)
Neutro Abs: 4.2 10*3/uL (ref 1.7–7.7)
Platelets: 266 10*3/uL (ref 150–400)
RBC: 4.13 MIL/uL — ABNORMAL LOW (ref 4.22–5.81)
RDW: 13.2 % (ref 11.5–15.5)
WBC: 8.4 10*3/uL (ref 4.0–10.5)

## 2013-12-17 LAB — BASIC METABOLIC PANEL
Anion gap: 13 (ref 5–15)
BUN: 19 mg/dL (ref 6–23)
CALCIUM: 9.5 mg/dL (ref 8.4–10.5)
CO2: 24 mEq/L (ref 19–32)
Chloride: 102 mEq/L (ref 96–112)
Creatinine, Ser: 1.26 mg/dL (ref 0.50–1.35)
GFR calc non Af Amer: 52 mL/min — ABNORMAL LOW (ref >90)
GFR, EST AFRICAN AMERICAN: 60 mL/min — AB (ref >90)
GLUCOSE: 130 mg/dL — AB (ref 70–99)
POTASSIUM: 4.5 meq/L (ref 3.7–5.3)
Sodium: 139 mEq/L (ref 137–147)

## 2013-12-17 LAB — TSH: TSH: 2.24 u[IU]/mL (ref 0.350–4.500)

## 2013-12-17 LAB — T4, FREE: Free T4: 1.22 ng/dL (ref 0.80–1.80)

## 2013-12-17 LAB — MAGNESIUM: MAGNESIUM: 2.3 mg/dL (ref 1.5–2.5)

## 2013-12-17 MED ORDER — ADULT MULTIVITAMIN W/MINERALS CH
1.0000 | ORAL_TABLET | Freq: Every day | ORAL | Status: DC
  Administered 2013-12-18: 1 via ORAL
  Filled 2013-12-17: qty 1

## 2013-12-17 MED ORDER — VITAMIN D 1000 UNITS PO TABS
1000.0000 [IU] | ORAL_TABLET | Freq: Every day | ORAL | Status: DC
  Administered 2013-12-18: 1000 [IU] via ORAL
  Filled 2013-12-17: qty 1

## 2013-12-17 MED ORDER — PANTOPRAZOLE SODIUM 40 MG PO TBEC
80.0000 mg | DELAYED_RELEASE_TABLET | Freq: Every day | ORAL | Status: DC
  Administered 2013-12-18: 80 mg via ORAL
  Filled 2013-12-17: qty 2

## 2013-12-17 MED ORDER — DABIGATRAN ETEXILATE MESYLATE 150 MG PO CAPS
150.0000 mg | ORAL_CAPSULE | Freq: Two times a day (BID) | ORAL | Status: DC
  Administered 2013-12-17 – 2013-12-18 (×2): 150 mg via ORAL
  Filled 2013-12-17 (×3): qty 1

## 2013-12-17 MED ORDER — VITAMIN C 500 MG PO TABS
500.0000 mg | ORAL_TABLET | Freq: Every day | ORAL | Status: DC
  Administered 2013-12-18: 500 mg via ORAL
  Filled 2013-12-17: qty 1

## 2013-12-17 MED ORDER — SODIUM CHLORIDE 0.9 % IV BOLUS (SEPSIS)
500.0000 mL | INTRAVENOUS | Status: AC
  Administered 2013-12-17: 500 mL via INTRAVENOUS

## 2013-12-17 MED ORDER — ACETAMINOPHEN 325 MG PO TABS: 650.0000 mg | ORAL_TABLET | ORAL | Status: DC | PRN

## 2013-12-17 MED ORDER — DILTIAZEM HCL 100 MG IV SOLR
5.0000 mg/h | INTRAVENOUS | Status: DC
  Administered 2013-12-17: 5 mg/h via INTRAVENOUS
  Filled 2013-12-17 (×2): qty 100

## 2013-12-17 MED ORDER — SALINE SPRAY 0.65 % NA SOLN
2.0000 | Freq: Two times a day (BID) | NASAL | Status: DC
  Administered 2013-12-17: 2 via NASAL
  Filled 2013-12-17: qty 44

## 2013-12-17 MED ORDER — VITAMIN E 180 MG (400 UNIT) PO CAPS
400.0000 [IU] | ORAL_CAPSULE | Freq: Every day | ORAL | Status: DC
  Administered 2013-12-18: 400 [IU] via ORAL
  Filled 2013-12-17: qty 1

## 2013-12-17 MED ORDER — DILTIAZEM HCL 100 MG IV SOLR
5.0000 mg/h | Freq: Once | INTRAVENOUS | Status: AC
  Administered 2013-12-17: 5 mg/h via INTRAVENOUS

## 2013-12-17 MED ORDER — ONDANSETRON HCL 4 MG/2ML IJ SOLN: 4.0000 mg | Freq: Four times a day (QID) | INTRAMUSCULAR | Status: DC | PRN

## 2013-12-17 MED ORDER — FLECAINIDE ACETATE 100 MG PO TABS
100.0000 mg | ORAL_TABLET | Freq: Two times a day (BID) | ORAL | Status: DC
  Administered 2013-12-17 – 2013-12-18 (×2): 100 mg via ORAL
  Filled 2013-12-17 (×3): qty 1

## 2013-12-17 NOTE — H&P (Signed)
Patient ID: William Stein MRN: [id number], DOB/AGE: May 07, 1930   Admit date: 12/17/2013   Primary Physician: Tommy Medal, MD Primary Cardiologist: Dr. Lovena Le  Pt. Profile:  Pleasant 80 year old Caucasian male with past medical history of paroxysmal atrial fibrillation on Pradaxa, hypertension, hyperlipidemia and GERD present with recurrent PAF  Problem List  Past Medical History  Diagnosis Date  . Bradycardia   . Atrial fibrillation 2007    a. On flecainide and pradaxa - pt says is compliant  . HTN (hypertension)   . HLD (hyperlipidemia)   . GERD (gastroesophageal reflux disease)   . Noncompliance     Past Surgical History  Procedure Laterality Date  . Doppler echocardiography  2011  . Exercise stress test  2012  . Colonoscopy  2003  . Cardiac catheterization  2009     Allergies  No Known Allergies  HPI  The patient is a pleasant 80 year old Caucasian male with past medical history of paroxysmal atrial fibrillation on Pradaxa, hypertension, hyperlipidemia and GERD. His last cardiac catheterization in March 2009 showed mild ectasia of the proximal LAD and proximal RCA, EF 60%. His last Myoview on 12/17/2010 showed no ischemia. His last echocardiogram in July 2013 showed mild to moderate dilated left atrium, preserved ejection fraction and grade 1 diastolic dysfunction.  He has been admitted multiple times in the last 4 months for recurrent atrial fibrillation. Patient was previously well controlled on Cardizem, flecainide and Pradaxa. He had an episode of weakness starting in early May which was thought secondary to atrial fibrillation and he eventually underwent cardioversion on 09/08/2013. Patient returned in June with recurrent atrial fibrillation, his Cardizem was increased from 180 mg daily to 240 mg. According to the patient, since his discharge, he has been compliant with his medication. He has underwent a great deal amount of stress recently with the passing of  his wife whom he cared for for more than 10 years, and also the recent suicide of his close male friend near the end of July. He denies ever having any chest pain or shortness of breath, however would have significant weakness during the onset of atrial fibrillation. He has no h/o thyroid problem. It also appears that he has cardiac awareness during the initial onset of atrial fibrillation, however he loses cardiac awareness after the atrial fibrillation persist for several hours.   He was last seen by Dr. Lovena Le on 11/13/2013, at which time he was doing well. He was informed to take extra dose of fleicainide if has recurrent atrial fibrillation.  Patient felt he went into atrial fibrillation in the afternoon of 12/16/2013, he also had an episode of weakness and severe dizziness around night time of 8/16. Patient decided to seek medical attention in the morning of 12/17/2013. On arrival to [facility] ED, he was noted to have a blood pressure of 127/84. Heart rate 112. Significant laboratory finding include sodium 139, creatinine 1.26, potassium 4.5, magnesium 2.3, glucose 130. EKG showed atrial fibrillation with heart rate in the 100-120s. Chest x-ray was negative for acute process. Cardiology was consulted for atrial fibrillation.   Home Medications  Prior to Admission medications   Medication Sig Start Date End Date Taking? Authorizing Provider  Cholecalciferol (VITAMIN D-3) 1000 UNITS CAPS Take 1,000 Units by mouth daily.    Yes Historical Provider, MD  dabigatran (PRADAXA) 150 MG CAPS Take 150 mg by mouth 2 (two) times daily.     Yes Historical Provider, MD  diltiazem (CARDIZEM CD) 240 MG 24 hr capsule  Take 1 capsule (240 mg total) by mouth daily. 11/22/13  Yes Evans Lance, MD  esomeprazole (NEXIUM) 40 MG capsule Take 40 mg by mouth daily at 12 noon.   Yes Historical Provider, MD  flecainide (TAMBOCOR) 100 MG tablet Take 100 mg by mouth 2 (two) times daily.   Yes Historical Provider, MD  Menthol,  Topical Analgesic, (BIOFREEZE EX) Apply 1 application topically 3 (three) times daily as needed (for pain).   Yes Historical Provider, MD  Multiple Vitamin (MULTIVITAMIN) tablet Take 1 tablet by mouth daily.     Yes Historical Provider, MD  sodium chloride (OCEAN) 0.65 % nasal spray Place 2 sprays into the nose 2 (two) times daily.   Yes Historical Provider, MD  vitamin C (ASCORBIC ACID) 500 MG tablet Take 500 mg by mouth daily.   Yes Historical Provider, MD  vitamin E 400 UNIT capsule Take 400 Units by mouth daily.   Yes Historical Provider, MD  flecainide (TAMBOCOR) 100 MG tablet TAKE ONE TABLET BY MOUTH TWICE DAILY 12/17/13   Evans Lance, MD    Family History  Family History  Problem Relation Age of Onset  . Dementia Father 78  . Other Mother 46    old age  . Coronary artery disease Neg Hx     Social History  History   Social History  . Marital Status: Widowed    Spouse Name: N/A    Number of Children: N/A  . Years of Education: N/A   Occupational History  . Full time [employer]   Social History Main Topics  . Smoking status: Never Smoker   . Smokeless tobacco: Not on file  . Alcohol Use: No  . Drug Use: No  . Sexual Activity: Not on file   Other Topics Concern  . Not on file   Social History Narrative   Widowed      Review of Systems General:  No chills, fever, night sweats or weight changes.  Cardiovascular:  No chest pain, dyspnea on exertion, edema, orthopnea, paroxysmal nocturnal dyspnea. Palpitation started yesterday afternoon, no further cardiac awareness Dermatological: No rash, lesions/masses Respiratory: No cough, dyspnea Urologic: No hematuria, dysuria Abdominal:   No nausea, vomiting, diarrhea, bright red blood per rectum, melena, or hematemesis. No bleeding problem on pradaxa Neurologic:  No visual changes, changes in mental status. 1 episode of dizziness and weakness last night, resolved All other systems reviewed  and are otherwise negative except as noted above.  Physical Exam  Blood pressure 104/68, pulse 105, temperature 97.5 F (36.4 C), temperature source Oral, resp. rate 14, SpO2 97.00%.  General: Pleasant, NAD Psych: Normal affect. Neuro: Alert and oriented X 3. Moves all extremities spontaneously. HEENT: Normal  Neck: Supple without bruits or JVD. Lungs:  Resp regular and unlabored, CTA. Heart: irregular no s3, s4, or murmurs. Abdomen: Soft, non-tender, non-distended, BS + x 4.  Extremities: No clubbing, cyanosis or edema. DP/PT/Radials 2+ and equal bilaterally.  Labs  Troponin (Point of Care Test) No results found for this basename: TROPIPOC,  in the last 72 hours No results found for this basename: CKTOTAL, CKMB, TROPONINI,  in the last 72 hours Lab Results  Component Value Date   WBC 8.4 12/17/2013   HGB 13.6 12/17/2013   HCT 38.9* 12/17/2013   MCV 94.2 12/17/2013   PLT 266 12/17/2013    Recent Labs Lab 12/17/13 0930  NA 139  K 4.5  CL 102  CO2 24  BUN 19  CREATININE  1.26  CALCIUM 9.5  GLUCOSE 130*   Lab Results  Component Value Date   CHOL 143 11/07/2011   HDL 33* 11/07/2011   LDLCALC 90 11/07/2011   TRIG 98 11/07/2011   No results found for this basename: DDIMER     Radiology/Studies  Dg Chest 2 View  12/17/2013   CLINICAL DATA:  History of hypertension and atrial fibrillation  EXAM: CHEST  2 VIEW  COMPARISON:  PA and lateral chest x-ray dated October 22, 2013  FINDINGS: The lungs are adequately inflated. There is no focal infiltrate. The heart and pulmonary vascularity are normal. There is mild stable wedge compression of T11 and T12 which accentuates the thoracic kyphosis. There is no pleural effusion or pneumothorax.  IMPRESSION: There is no evidence of CHF nor other acute cardiopulmonary abnormality.   Electronically Signed   By: David  Martinique   On: 12/17/2013 10:19    ECG  Atrial fibrillation with heart rate in the 100s  ASSESSMENT AND PLAN  1. Recurrent  a-fib  - on pradaxa, cardizem and fleicainide  - is a candidate for cardioversion in ED, however given his advanced age, PAF will likely to recur despite cardioversion. Will ask EP to see, possibly increase his fleicainide to 100mg  Q8hr if his renal function improve (fleicanide level 0.42 in June. not sure if need to repeat stress test in that case). Can even consider leave pt in a-fib if able to control his HR and he has no symptom during ambulation  - obtain echo to check LA size, check TSH  - h/o severe symptomatic bradycardia in 2009 while on combination diltiazem and BB, will not add BB at this point  - continue diltiazem gtt for rate control, transition to PO diltiazem in am  2. Hypertension 3. Hyperlipidemia 4. Bradycardia: during 2009, 2/2 combination of diltiazem and BB 5. Acute on CKD, stage II  Signed, Almyra Deforest, PA-C 12/17/2013, 12:42 PM  I have personally seen and examined this patient with Almyra Deforest, PA-C. I agree with the assessment and plan as outlined above. Mr Shabazz seems to be having more frequent episodes of atrial fibrillation. He is clearly in atrial fib today with RVR on admission. Rate controlled now with IV cardizem. He also feels that he is becoming more symptomatic when in atrial fibrillation. He is taking Pradaxa twice daily. Will admit to telemetry and have EP see him to discuss his medications and potentially altering his regimen for goal of HR control only or discuss DCCV in am with adjustment of his medications to better attempt rhythm control. We discussed DCCV in the ED and discharge home but he would prefer to wait and see if he returns to sinus rhythm today and also discuss his regimen with our EP team. Will make NPO at midnight tonight in case DCCV is necessary in the am.   Janiaya Ryser 12/17/2013 1:27 PM

## 2013-12-17 NOTE — ED Notes (Signed)
Pt reports hx of atrial fib, onset of irregular HR and fatigue yesterday. Pt took extra dose of medication but no relief. EKG done at triage.

## 2013-12-17 NOTE — ED Provider Notes (Signed)
CSN: [id number]     Arrival date & time 12/17/13  0907 History   First MD Initiated Contact with Patient 12/17/13 0913     Chief Complaint  Patient presents with  . Atrial Fibrillation  . Fatigue     HPI  Patient presents with palpitations, generalized weakness. Symptoms began approximately 30 hours ago. Since onset symptoms have been persistent. There is no associated chest pain, headache, syncope, nausea, vomiting, diarrhea. Patient has taken his typical medication, and one additional dose of flecainide, with no changes in symptoms.  8 mm a history of atrial fibrillation/flutter, and is currently working with his cardiologist for management.  Past Medical History  Diagnosis Date  . Bradycardia   . Atrial fibrillation 2007    a. On flecainide and pradaxa - pt says is compliant  . HTN (hypertension)   . HLD (hyperlipidemia)   . GERD (gastroesophageal reflux disease)   . Noncompliance    Past Surgical History  Procedure Laterality Date  . Doppler echocardiography  2011  . Exercise stress test  2012  . Colonoscopy  2003  . Cardiac catheterization  2009   Family History  Problem Relation Age of Onset  . Dementia Father 23  . Other Mother 25    old age  . Coronary artery disease Neg Hx    History  Substance Use Topics  . Smoking status: Never Smoker   . Smokeless tobacco: Not on file  . Alcohol Use: No    Review of Systems  Constitutional:       Per HPI, otherwise negative  HENT:       Per HPI, otherwise negative  Respiratory:       Per HPI, otherwise negative  Cardiovascular:       Per HPI, otherwise negative  Gastrointestinal: Negative for vomiting.  Endocrine:       Negative aside from HPI  Genitourinary:       Neg aside from HPI   Musculoskeletal:       Per HPI, otherwise negative  Skin: Negative.   Neurological: Negative for syncope.      Allergies  Review of patient's allergies indicates no known allergies.  Home Medications   Prior to  Admission medications   Medication Sig Start Date End Date Taking? Authorizing Provider  Cholecalciferol (VITAMIN D-3) 1000 UNITS CAPS Take 1,000 Units by mouth daily.    Yes Historical Provider, MD  dabigatran (PRADAXA) 150 MG CAPS Take 150 mg by mouth 2 (two) times daily.     Yes Historical Provider, MD  diltiazem (CARDIZEM CD) 240 MG 24 hr capsule Take 1 capsule (240 mg total) by mouth daily. 11/22/13  Yes Evans Lance, MD  esomeprazole (NEXIUM) 40 MG capsule Take 40 mg by mouth daily at 12 noon.   Yes Historical Provider, MD  flecainide (TAMBOCOR) 100 MG tablet Take 100 mg by mouth 2 (two) times daily.   Yes Historical Provider, MD  HYDROcodone-acetaminophen (NORCO/VICODIN) 5-325 MG per tablet Take 1 tablet by mouth every 6 (six) hours as needed for moderate pain.   Yes Historical Provider, MD  lidocaine (LIDODERM) 5 % Place 1 patch onto the skin daily. Remove & Discard patch within 12 hours or as directed by MD 10/23/13  Yes Geradine Girt, DO  Multiple Vitamin (MULTIVITAMIN) tablet Take 1 tablet by mouth daily.     Yes Historical Provider, MD  sodium chloride (OCEAN) 0.65 % nasal spray Place 2 sprays into the nose 2 (two) times daily.  Yes Historical Provider, MD  vitamin E 400 UNIT capsule Take 400 Units by mouth daily.   Yes Historical Provider, MD   BP 127/84  Pulse 112  Temp(Src) 97.8 F (36.6 C) (Oral)  Resp 16  SpO2 97% Physical Exam  Nursing note and vitals reviewed. Constitutional: He is oriented to person, place, and time. He appears well-developed. No distress.  HENT:  Head: Normocephalic and atraumatic.  Eyes: Conjunctivae and EOM are normal.  Cardiovascular: An irregularly irregular rhythm present. Tachycardia present.   Pulmonary/Chest: Effort normal. No stridor. No respiratory distress.  Abdominal: He exhibits no distension.  Musculoskeletal: He exhibits no edema.  Neurological: He is alert and oriented to person, place, and time.  Skin: Skin is warm and dry.   Psychiatric: He has a normal mood and affect.    ED Course  Procedures (including critical care time)  I reviewed the patient's chart, as well as all labs and studies.   EKG Interpretation   Date/Time:  Monday December 17 2013 09:09:49 EDT Ventricular Rate:  109 PR Interval:  130 QRS Duration: 90 QT Interval:  350 QTC Calculation: 471 R Axis:   68 Text Interpretation:  Sinus tachycardia Nonspecific ST abnormality  Abnormal ECG Atrial fibrillation versus aberant conduction ST-t wave  abnormality Abnormal ekg Confirmed by Carmin Muskrat  MD ([id number]) on  12/17/2013 9:16:49 AM     Update: Patient appears calm, heart rate is 105 ish   Update: Patient has no pain.  Heart rate remains in I 100/105 range, the blood pressure is now 208 systolic. I have discussed patient's case with cardiology.  MDM   Patient presents with palpitations, and is found to have atrial fibrillation. Patient has not had resolution with flecainide.  The patient is taking Pradaxa.  Given the patient's persistent arrhythmia, tachycardia, patient was started on a Cardizem drip. This was generally well tolerated, the patient had a decrease in blood pressure. Patient did not have conversion to sinus rhythm, and given his persistent tachycardia, patient admission to the cardiology service.   CRITICAL CARE Performed by: Carmin Muskrat Total critical care time: 35 Critical care time was exclusive of separately billable procedures and treating other patients. Critical care was necessary to treat or prevent imminent or life-threatening deterioration. Critical care was time spent personally by me on the following activities: development of treatment plan with patient and/or surrogate as well as nursing, discussions with consultants, evaluation of patient's response to treatment, examination of patient, obtaining history from patient or surrogate, ordering and performing treatments and interventions, ordering and  review of laboratory studies, ordering and review of radiographic studies, pulse oximetry and re-evaluation of patient's condition.    Carmin Muskrat, MD 12/17/13 1346

## 2013-12-18 ENCOUNTER — Encounter (HOSPITAL_COMMUNITY): Payer: Self-pay | Admitting: Certified Registered Nurse Anesthetist

## 2013-12-18 ENCOUNTER — Encounter (HOSPITAL_COMMUNITY): Payer: PRIVATE HEALTH INSURANCE | Admitting: Anesthesiology

## 2013-12-18 ENCOUNTER — Encounter (HOSPITAL_COMMUNITY)
Admission: EM | Disposition: A | Discharge status: Home / Self Care | Payer: Self-pay | Source: Home / Self Care | Attending: Emergency Medicine

## 2013-12-18 ENCOUNTER — Encounter (HOSPITAL_COMMUNITY): Payer: Self-pay

## 2013-12-18 ENCOUNTER — Observation Stay (HOSPITAL_COMMUNITY): Payer: PRIVATE HEALTH INSURANCE | Admitting: Anesthesiology

## 2013-12-18 DIAGNOSIS — I059 Rheumatic mitral valve disease, unspecified: Secondary | ICD-10-CM

## 2013-12-18 DIAGNOSIS — I4892 Unspecified atrial flutter: Secondary | ICD-10-CM | POA: Diagnosis not present

## 2013-12-18 DIAGNOSIS — I4891 Unspecified atrial fibrillation: Secondary | ICD-10-CM | POA: Diagnosis not present

## 2013-12-18 HISTORY — PX: CARDIOVERSION: SHX1299

## 2013-12-18 LAB — BASIC METABOLIC PANEL
Anion gap: 12 (ref 5–15)
BUN: 13 mg/dL (ref 6–23)
CO2: 20 mEq/L (ref 19–32)
Calcium: 8.6 mg/dL (ref 8.4–10.5)
Chloride: 102 mEq/L (ref 96–112)
Creatinine, Ser: 0.87 mg/dL (ref 0.50–1.35)
GFR calc Af Amer: >90 mL/min (ref >90)
GFR calc non Af Amer: 79 mL/min — ABNORMAL LOW (ref >90)
Glucose, Bld: 122 mg/dL — ABNORMAL HIGH (ref 70–99)
Potassium: 4.3 mEq/L (ref 3.7–5.3)
Sodium: 134 mEq/L — ABNORMAL LOW (ref 137–147)

## 2013-12-18 LAB — TROPONIN I: Troponin I: <0.3 ng/mL (ref <0.30)

## 2013-12-18 SURGERY — CARDIOVERSION
Anesthesia: General

## 2013-12-18 SURGERY — Surgical Case
Anesthesia: *Unknown

## 2013-12-18 SURGERY — ECHOCARDIOGRAM, TRANSESOPHAGEAL
Anesthesia: Monitor Anesthesia Care

## 2013-12-18 MED ORDER — AMIODARONE HCL 200 MG PO TABS: 400.0000 mg | ORAL_TABLET | Freq: Every day | ORAL | Status: DC

## 2013-12-18 MED ORDER — LIDOCAINE HCL (CARDIAC) 20 MG/ML IV SOLN
INTRAVENOUS | Status: DC | PRN
  Administered 2013-12-18: 20 mg via INTRAVENOUS

## 2013-12-18 MED ORDER — PROPOFOL 10 MG/ML IV BOLUS
INTRAVENOUS | Status: DC | PRN
  Administered 2013-12-18: 40 mg via INTRAVENOUS

## 2013-12-18 NOTE — CV Procedure (Signed)
Electrical Cardioversion Procedure Note William Stein [id number] [birth year]  Procedure: Electrical Cardioversion Indications:  Atrial Flutter  Procedure Details Consent: Risks of procedure as well as the alternatives and risks of each were explained to the (patient/caregiver).  Consent for procedure obtained. Time Out: Verified patient identification, verified procedure, site/side was marked, verified correct patient position, special equipment/implants available, medications/allergies/relevent history reviewed, required imaging and test results available.  Performed  Patient placed on cardiac monitor, pulse oximetry, supplemental oxygen as necessary.  Sedation given: Patient sedated by anesthesia with lidocaine 20 mg and diprovan 40 mg IV. Pacer pads placed anterior and posterior chest.  Cardioverted 1 time(s).  Cardioverted at 120J.  Evaluation Findings: Post procedure EKG shows: NSR Complications: None Patient did tolerate procedure well.   Kirk Ruths 12/18/2013, 11:22 AM

## 2013-12-18 NOTE — Anesthesia Postprocedure Evaluation (Signed)
  Anesthesia Post-op Note  Patient: William Stein  Procedure(s) Performed: Procedure(s): CARDIOVERSION (N/A)  Patient Location: Endoscopy Unit  Anesthesia Type:General  Level of Consciousness: awake, alert  and oriented  Airway and Oxygen Therapy: Patient Spontanous Breathing and Patient connected to nasal cannula oxygen  Post-op Pain: none  Post-op Assessment: Post-op Vital signs reviewed, Patient's Cardiovascular Status Stable, Respiratory Function Stable and Patent Airway  Post-op Vital Signs: Reviewed and stable  Last Vitals:  Filed Vitals:   12/18/13 1127  BP:   Pulse:   Temp:   Resp: 16    Complications: No apparent anesthesia complications

## 2013-12-18 NOTE — Anesthesia Preprocedure Evaluation (Addendum)
Anesthesia Evaluation  Patient identified by MRN, date of birth, ID band Patient awake    Reviewed: Allergy & Precautions, H&P , NPO status , Patient's Chart, lab work & pertinent test results  History of Anesthesia Complications Negative for: history of anesthetic complications  Airway Mallampati: I TM Distance: >3 FB Neck ROM: Full    Dental  (+) Teeth Intact, Dental Advisory Given   Pulmonary neg pulmonary ROS,  breath sounds clear to auscultation        Cardiovascular hypertension, Pt. on medications - angina+ dysrhythmias Atrial Fibrillation Rhythm:Irregular Rate:Normal  12/18/13 ECHO: EF 60-65%, mild MR   Neuro/Psych negative neurological ROS     GI/Hepatic Neg liver ROS, GERD-  Medicated and Controlled,  Endo/Other  negative endocrine ROS  Renal/GU negative Renal ROS     Musculoskeletal   Abdominal   Peds  Hematology  (+) Blood dyscrasia (pradaxa), ,   Anesthesia Other Findings   Reproductive/Obstetrics                      Anesthesia Physical Anesthesia Plan  ASA: III  Anesthesia Plan: General   Post-op Pain Management:    Induction: Intravenous  Airway Management Planned: Mask  Additional Equipment:   Intra-op Plan:   Post-operative Plan:   Informed Consent: I have reviewed the patients History and Physical, chart, labs and discussed the procedure including the risks, benefits and alternatives for the proposed anesthesia with the patient or authorized representative who has indicated his/her understanding and acceptance.   Dental advisory given  Plan Discussed with: CRNA, Anesthesiologist and Surgeon  Anesthesia Plan Comments: (Plan routine monitors, GA for cardioversion)      Anesthesia Quick Evaluation

## 2013-12-18 NOTE — Progress Notes (Addendum)
Patient ID: TUPAC JEFFUS, male   DOB: 08-Jul-1930, 80 y.o.   MRN: [id number]   Patient Name: William Stein Date of Encounter: 12/18/2013     Active Problems:   PAF (paroxysmal atrial fibrillation)   The patient is a pleasant 80 year old Caucasian male with past medical history of paroxysmal atrial fibrillation on Pradaxa, hypertension, hyperlipidemia and GERD. His last cardiac catheterization in March 2009 showed mild ectasia of the proximal LAD and proximal RCA, EF 60%. His last Myoview on 12/17/2010 showed no ischemia. His last echocardiogram in July 2013 showed mild to moderate dilated left atrium, preserved ejection fraction and grade 1 diastolic dysfunction.  He has been admitted multiple times in the last 4 months for recurrent atrial fibrillation. Patient was previously well controlled on Cardizem, flecainide and Pradaxa. He had an episode of weakness starting in early May which was thought secondary to atrial fibrillation and he eventually underwent cardioversion on 09/08/2013. Patient returned in June with recurrent atrial fibrillation, his Cardizem was increased from 180 mg daily to 240 mg. According to the patient, since his discharge, he has been compliant with his medication. He has underwent a great deal amount of stress recently with the passing of his wife whom he cared for for more than 10 years, and also the recent suicide of his close male friend near the end of July. He denies ever having any chest pain or shortness of breath, however would have significant weakness during the onset of atrial fibrillation. He has no h/o thyroid problem. It also appears that he has cardiac awareness during the initial onset of atrial fibrillation, however he loses cardiac awareness after the atrial fibrillation persist for several hours.  He was last seen by Dr. Lovena Le on 11/13/2013, at which time he was doing well. He was informed to take extra dose of fleicainide if has recurrent atrial  fibrillation. Patient felt he went into atrial fibrillation in the afternoon of 12/16/2013, he also had an episode of weakness and severe dizziness around night time of 8/16. Patient decided to seek medical attention in the morning of 12/17/2013. On arrival to [facility] ED, he was noted to have a blood pressure of 127/84. Heart rate 112. Significant laboratory finding include sodium 139, creatinine 1.26, potassium 4.5, magnesium 2.3, glucose 130. EKG showed atrial fibrillation with heart rate in the 100-120s. Chest x-ray was negative for acute process He currently denies chest pain or sob. Still with palpitations.  PMHX 1. HTN 2. Atrial fib 3. Sinus brady  FHX - no premature CAD Social Hx - No tobacco or ETOH  CURRENT MEDS . cholecalciferol  1,000 Units Oral Daily  . dabigatran  150 mg Oral BID  . flecainide  100 mg Oral Q12H  . multivitamin with minerals  1 tablet Oral Daily  . pantoprazole  80 mg Oral Q1200  . sodium chloride  2 spray Nasal BID  . vitamin C  500 mg Oral Daily  . vitamin E  400 Units Oral Daily    OBJECTIVE  Filed Vitals:   12/17/13 1445 12/17/13 1533 12/17/13 2031 12/18/13 0435  BP: 98/80 117/72 131/76 117/63  Pulse: 128 123 90 71  Temp:  98.3 F (36.8 C) 97.6 F (36.4 C) 97.8 F (36.6 C)  TempSrc:  Oral Oral Oral  Resp: 20 22 18 18   Height:  5\' 7"  (1.702 m)    Weight:  183 lb 6.4 oz (83.19 kg)  183 lb (83.008 kg)  SpO2: 93% 97% 99% 100%  Intake/Output Summary (Last 24 hours) at 12/18/13 0745 Last data filed at 12/18/13 0981  Gross per 24 hour  Intake    360 ml  Output   1400 ml  Net  -1040 ml   Filed Weights   12/17/13 1533 12/18/13 0435  Weight: 183 lb 6.4 oz (83.19 kg) 183 lb (83.008 kg)    PHYSICAL EXAM  General: Pleasant, NAD. Neuro: Alert and oriented X 3. Moves all extremities spontaneously. Psych: Normal affect. HEENT:  Normal  Neck: Supple without bruits or JVD. Lungs:  Resp regular and unlabored, CTA. Heart: IRRR no s3, s4, or  murmurs. Abdomen: Soft, non-tender, non-distended, BS + x 4.  Extremities: No clubbing, cyanosis or edema. DP/PT/Radials 2+ and equal bilaterally.  Accessory Clinical Findings  CBC  Recent Labs  12/17/13 0930  WBC 8.4  NEUTROABS 4.2  HGB 13.6  HCT 38.9*  MCV 94.2  PLT 191   Basic Metabolic Panel  Recent Labs  12/17/13 0930 12/18/13 0420  NA 139 134*  K 4.5 4.3  CL 102 102  CO2 24 20  GLUCOSE 130* 122*  BUN 19 13  CREATININE 1.26 0.87  CALCIUM 9.5 8.6  MG 2.3  --    Liver Function Tests No results found for this basename: AST, ALT, ALKPHOS, BILITOT, PROT, ALBUMIN,  in the last 72 hours No results found for this basename: LIPASE, AMYLASE,  in the last 72 hours Cardiac Enzymes  Recent Labs  12/17/13 1610 12/17/13 2142 12/18/13 0420  TROPONINI <0.30 <0.30 <0.30   BNP No components found with this basename: POCBNP,  D-Dimer No results found for this basename: DDIMER,  in the last 72 hours Hemoglobin A1C No results found for this basename: HGBA1C,  in the last 72 hours Fasting Lipid Panel No results found for this basename: CHOL, HDL, LDLCALC, TRIG, CHOLHDL, LDLDIRECT,  in the last 72 hours Thyroid Function Tests  Recent Labs  12/17/13 1610  TSH 2.240    TELE  Atrial fib with a RVR  Radiology/Studies  Dg Chest 2 View  12/17/2013   CLINICAL DATA:  History of hypertension and atrial fibrillation  EXAM: CHEST  2 VIEW  COMPARISON:  PA and lateral chest x-ray dated October 22, 2013  FINDINGS: The lungs are adequately inflated. There is no focal infiltrate. The heart and pulmonary vascularity are normal. There is mild stable wedge compression of T11 and T12 which accentuates the thoracic kyphosis. There is no pleural effusion or pneumothorax.  IMPRESSION: There is no evidence of CHF nor other acute cardiopulmonary abnormality.   Electronically Signed   By: David  Martinique   On: 12/17/2013 10:19    ASSESSMENT AND PLAN 1. PAF, maintaining atrial fib 2. HTN Rec:  I have discussed the treatment options. He will need DCCV today and then he can be discharged home. We will need to switch from flecainide which appears to be no longer effective to amiodarone. Will start amio 400 mg daily at discharge. He will continue Pradaxa.  Gregg Taylor,M.D.  12/18/2013 7:45 AM

## 2013-12-18 NOTE — Progress Notes (Signed)
  Echocardiogram 2D Echocardiogram has been performed.  Coulter Oldaker FRANCES 12/18/2013, 8:39 AM

## 2013-12-18 NOTE — Transfer of Care (Signed)
Immediate Anesthesia Transfer of Care Note  Patient: William Stein  Procedure(s) Performed: Procedure(s): CARDIOVERSION (N/A)  Patient Location: Endoscopy Unit  Anesthesia Type:General  Level of Consciousness: awake, alert  and oriented  Airway & Oxygen Therapy: Patient Spontanous Breathing and Patient connected to nasal cannula oxygen  Post-op Assessment: Report given to PACU RN, Post -op Vital signs reviewed and stable and Patient moving all extremities X 4  Post vital signs: Reviewed and stable  Complications: No apparent anesthesia complications

## 2013-12-18 NOTE — Discharge Summary (Signed)
Discharge Summary   Patient ID: ONIS MARKOFF MRN: [id number], DOB/AGE: 1930-12-01 80 y.o. Admit date: 12/17/2013 D/C date:     12/18/2013  Primary Care Provider: Tommy Medal, MD Primary Cardiologist: Dr. Lovena Le, MD   Primary Discharge Diagnoses:  1. Paroxysmal a-fib, s/p DCCV this admission 2. HTN 3. History of bradycardia during 2009, 2/2 combination of diltiazem and BB  4. Acute on CKD, stage II  Secondary Discharge Diagnoses:  1. Hyperlipidemia 2. GERD 3. Prior h/o noncompliance - but patient endorses recent continued compliance  Hospital Course: The patient is a pleasant 80 year old Caucasian male with past medical history of paroxysmal atrial fibrillation on Pradaxa, hypertension, hyperlipidemia and GERD. His last cardiac catheterization in March 2009 showed mild ectasia of the proximal LAD and proximal RCA, EF 60%. His last Myoview on 12/17/2010 showed no ischemia. Prior echocardiogram in July 2013 showed mild to moderate dilated left atrium, preserved ejection fraction and grade 1 diastolic dysfunction.   He has been admitted multiple times in the last 4 months for recurrent atrial fibrillation. Patient was previously well controlled on Cardizem, flecainide and Pradaxa. He had an episode of weakness starting in early May which was thought secondary to atrial fibrillation and he eventually underwent cardioversion on 09/08/2013. Patient returned in June with recurrent atrial fibrillation. His Cardizem was increased from 180 mg daily to 240 mg. According to the patient, since his discharge, he has been compliant with his medication including Pradaxa. He has underwent a great deal amount of stress recently with the passing of his wife whom he cared for for more than 10 years, and also the recent suicide of his close male friend near the end of July. He denies ever having any chest pain or shortness of breath, however would have significant weakness during the onset of atrial  fibrillation. He has no h/o thyroid problem. It also appears that he has cardiac awareness during the initial onset of atrial fibrillation, however he loses cardiac awareness after the atrial fibrillation persist for several hours.   He was last seen by Dr. Lovena Le on 11/13/2013, at which time he was doing well. He was informed to take extra dose of fleicainide if has recurrent atrial fibrillation. Patient felt he went into atrial fibrillation in the afternoon of 12/16/2013. He also had an episode of weakness and severe dizziness around night time of 8/16. He decided to seek medical attention in the morning of 12/17/2013. On arrival to [facility] ED, he was noted to have a blood pressure of 127/84. Heart rate 112. Significant labs include sodium 139, creatinine 1.26, potassium 4.5, magnesium 2.3, glucose 130. EKG showed atrial fibrillation with heart rate in the 100-120s. Chest x-ray was negative for acute process. He was admitted for further management. He ruled out for MI. He was initially placed on IV Cardizem for rate control. The following morning he was seen by EP who recommended DCCV, discontinuation of flecainide and Diltiazem and initiation of amiodarone. This afternoon he underwent successful DCCV to NSR. He tolerated this well. Per Dr. Tanna Furry recommendations we will start amiodarone 400 mg daily. Further adjustment of medicine and monitoring parameters will be at the discretion of his primary cardiologist. CMET and thyroid function were unremarkable this admission. The patient also had a 2-D echo this admission that showed EF 60-65%, no WMA, mild MR. Dr. Lovena Le has seen and examined the patient and feels he is stable for discharge.  Discharge Vitals: Blood pressure 123/59, pulse 68, temperature 97.8 F (36.6 C), temperature source  Oral, resp. rate 16, height 5\' 7"  (1.702 m), weight 183 lb (83.008 kg), SpO2 100.00%.  Labs: Lab Results  Component Value Date   WBC 8.4 12/17/2013   HGB 13.6 12/17/2013     HCT 38.9* 12/17/2013   MCV 94.2 12/17/2013   PLT 266 12/17/2013    Recent Labs Lab 12/18/13 0420  NA 134*  K 4.3  CL 102  CO2 20  BUN 13  CREATININE 0.87  CALCIUM 8.6  GLUCOSE 122*    Recent Labs  12/17/13 1610 12/17/13 2142 12/18/13 0420  TROPONINI <0.30 <0.30 <0.30   Lab Results  Component Value Date   CHOL 143 11/07/2011   HDL 33* 11/07/2011   LDLCALC 90 11/07/2011   TRIG 98 11/07/2011      Diagnostic Studies/Procedures   Dg Chest 2 View  12/17/2013   CLINICAL DATA:  History of hypertension and atrial fibrillation  EXAM: CHEST  2 VIEW  COMPARISON:  PA and lateral chest x-ray dated October 22, 2013  FINDINGS: The lungs are adequately inflated. There is no focal infiltrate. The heart and pulmonary vascularity are normal. There is mild stable wedge compression of T11 and T12 which accentuates the thoracic kyphosis. There is no pleural effusion or pneumothorax.  IMPRESSION: There is no evidence of CHF nor other acute cardiopulmonary abnormality.   Electronically Signed   By: David  Martinique   On: 12/17/2013 10:19   Echo 12/18/13: Study Conclusions  - Left ventricle: The cavity size was normal. Wall thickness was normal. Systolic function was normal. The estimated ejection fraction was in the range of 60% to 65%. Wall motion was normal; there were no regional wall motion abnormalities. - Aortic valve: There was trivial regurgitation. - Mitral valve: There was mild regurgitation.  DCCV 12/18/13 as above  Discharge Medications   Current Discharge Medication List    START taking these medications   Details  amiodarone (PACERONE) 200 MG tablet Take 2 tablets (400 mg total) by mouth daily. Qty: 60 tablet, Refills: 1      CONTINUE these medications which have NOT CHANGED   Details  Cholecalciferol (VITAMIN D-3) 1000 UNITS CAPS Take 1,000 Units by mouth daily.     dabigatran (PRADAXA) 150 MG CAPS Take 150 mg by mouth 2 (two) times daily.      esomeprazole (NEXIUM) 40 MG  capsule Take 40 mg by mouth daily at 12 noon.    Menthol, Topical Analgesic, (BIOFREEZE EX) Apply 1 application topically 3 (three) times daily as needed (for pain).    Multiple Vitamin (MULTIVITAMIN) tablet Take 1 tablet by mouth daily.      sodium chloride (OCEAN) 0.65 % nasal spray Place 2 sprays into the nose 2 (two) times daily.    vitamin C (ASCORBIC ACID) 500 MG tablet Take 500 mg by mouth daily.    vitamin E 400 UNIT capsule Take 400 Units by mouth daily.      STOP taking these medications     diltiazem (CARDIZEM CD) 240 MG 24 hr capsule      flecainide (TAMBOCOR) 100 MG tablet      flecainide (TAMBOCOR) 100 MG tablet         Disposition   The patient will be discharged in stable condition to home. Discharge Instructions   Diet - low sodium heart healthy    Complete by:  As directed      Increase activity slowly    Complete by:  As directed   Dr. Lovena Le has stopped your Flecainide  and Diltiazem. You will start amiodarone tomorrow.          Follow-up Information   Follow up with Melina Copa, PA-C On 01/10/2014. (Appointment 10:15 AM [facility]))    Specialty:  Cardiology   Contact information:   [address] [place] [address] [phone]         Duration of Discharge Encounter: Greater than 30 minutes including physician and PA time.  Melvern Banker PA-C 12/18/2013, 4:12 PM

## 2013-12-19 ENCOUNTER — Encounter (HOSPITAL_COMMUNITY): Payer: Self-pay | Admitting: Cardiology

## 2013-12-27 ENCOUNTER — Telehealth: Payer: Self-pay | Admitting: Internal Medicine

## 2013-12-27 NOTE — Telephone Encounter (Signed)
New message     Pt has a hosp f/u scheduled for 9-10 but he is in AFIB now.  Should he be seen sooner or can he wait until then?

## 2013-12-27 NOTE — Telephone Encounter (Signed)
Patient reports being back in afib, Had cardioversion and was dc'd from hospital 8/18. Reviewed medications. He is ordered amiodarone 400 mg daily. He was taking 200 BID. Instructed him to take the 2nd pill right now, and tomorrow begin taking both pills at once in the morning. He verbalizes understanding. He denies symptoms of afib, other than being aware that his heart beat is irregular. Current BP 132/66, HR 65. Advised will forward message to Dr. Lovena Le and Claiborne Billings.

## 2014-01-02 ENCOUNTER — Ambulatory Visit: Payer: PRIVATE HEALTH INSURANCE | Admitting: Internal Medicine

## 2014-01-02 DIAGNOSIS — Z0000 Encounter for general adult medical examination without abnormal findings: Secondary | ICD-10-CM | POA: Diagnosis not present

## 2014-01-02 DIAGNOSIS — I4891 Unspecified atrial fibrillation: Secondary | ICD-10-CM | POA: Diagnosis not present

## 2014-01-02 DIAGNOSIS — Z1331 Encounter for screening for depression: Secondary | ICD-10-CM | POA: Diagnosis not present

## 2014-01-03 DIAGNOSIS — M47817 Spondylosis without myelopathy or radiculopathy, lumbosacral region: Secondary | ICD-10-CM | POA: Diagnosis not present

## 2014-01-03 DIAGNOSIS — M5380 Other specified dorsopathies, site unspecified: Secondary | ICD-10-CM | POA: Diagnosis not present

## 2014-01-03 DIAGNOSIS — G894 Chronic pain syndrome: Secondary | ICD-10-CM | POA: Diagnosis not present

## 2014-01-09 ENCOUNTER — Encounter: Payer: Self-pay | Admitting: Physician Assistant

## 2014-01-09 DIAGNOSIS — R739 Hyperglycemia, unspecified: Secondary | ICD-10-CM | POA: Insufficient documentation

## 2014-01-09 DIAGNOSIS — N182 Chronic kidney disease, stage 2 (mild): Secondary | ICD-10-CM | POA: Insufficient documentation

## 2014-01-09 NOTE — Progress Notes (Signed)
[place], [place], [place]  [address] Phone: [phone] Fax:  [phone]  Date:  01/10/2014   Patient ID:  William, Stein [birth year], MRN [id number]   PCP:  Tommy Medal, MD  Cardiologist: Dr. Lovena Le   History of Present Illness: William Stein is a 80 y.o. male with history of PAF, HTN, bradycardia 2009 2/2 combo of BB+diltiazem, CKD stage II, HLD who presents for hospital f/u. His last cardiac catheterization in March 2009 showed mild ectasia of the proximal LAD and proximal RCA, EF 60%. His last Myoview on 12/17/2010 showed no ischemia. He has been admitted multiple times in the last 4 months for recurrent atrial fibrillation. Historically he has cardiac awareness during the initial onset of atrial fibrillation, however he loses the ability to detect it after the atrial fibrillation persists for several hours. He has also had increased stress lately with the suicide of a friend. He also misses his wife of 69 years who died 6 years ago. He was previously well controlled on Cardizem, flecainide and Pradaxa. He had an episode of weakness starting in early May which was thought secondary to atrial fibrillation and he eventually underwent cardioversion on 09/08/2013. He returned in June with recurrent atrial fibrillation. His Cardizem was increased from 180 mg daily to 240 mg. He was seen by Dr. Lovena Le on 11/13/2013, at which time he was doing well. He felt he went into atrial fibrillation in the afternoon of 12/16/2013 associated with weakness and dizziness. He sought medical care the morning of 12/17/13. On arrival to [facility] ED, he was noted to have a blood pressure of 127/84. Heart rate 112. Significant labs include sodium 139, creatinine 1.26, potassium 4.5, magnesium 2.3, glucose 130. EKG showed atrial fibrillation with heart rate in the 100-120s. Chest x-ray was negative for acute process. He was admitted for further management and ruled out for MI. He was initially  placed on IV Cardizem for rate control. The following morning he was seen by EP who recommended DCCV, discontinuation of flecainide and Diltiazem and initiation of amiodarone. He underwent successful DCCV to NSR. Per Dr. Tanna Furry recommendations he was started on amiodarone 400 mg daily. CMET and thyroid function were unremarkable that admission. The patient also had a 2-D echo 12/18/13 that showed EF 60-65%, no WMA, mild MR. He is on Pradaxa for anticoagulation.  He is doing well. "For an 80 year old, I live a wild life," he says. 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 He is trying to get a better sleep schedule. No CP, SOB. He does feel rare palpitations but does not think he has gone back into sustained atrial fib since starting amiodarone. No falls or bleeding.  Recent Labs: 10/22/2013: Pro B Natriuretic peptide (BNP) 2367.0*  10/23/2013: ALT 15  12/17/2013: Hemoglobin 13.6; TSH 2.240  12/18/2013: Creatinine 0.87; Potassium 4.3   Wt Readings from Last 3 Encounters:  01/10/14 187 lb (84.823 kg)  12/18/13 183 lb (83.008 kg)  12/18/13 183 lb (83.008 kg)     Past Medical History  Diagnosis Date  . Bradycardia     a. during 2009, 2/2 combination of diltiazem and BB  . PAF (paroxysmal atrial fibrillation) 2007    a. Previously on flecainide. b. 12/2013: s/p DCCV, started on amio. AC w/ pradaxa.  Marland Kitchen HTN (hypertension)   . HLD (hyperlipidemia)   . GERD (gastroesophageal reflux disease)   . Noncompliance  a. Prior issue - pt more recently endorses compliance with meds.  . CKD (chronic kidney disease), stage II     Current Outpatient Prescriptions  Medication Sig Dispense Refill  . amiodarone (PACERONE) 200 MG tablet Take 2 tablets (400 mg total) by mouth daily.  60 tablet  1  . Cholecalciferol (VITAMIN D-3) 1000 UNITS CAPS Take 1,000 Units by mouth daily.       . dabigatran (PRADAXA) 150 MG CAPS Take 150 mg by mouth 2 (two) times daily.         Marland Kitchen esomeprazole (NEXIUM) 40 MG capsule Take 40 mg by mouth daily at 12 noon.      . Menthol, Topical Analgesic, (BIOFREEZE EX) Apply 1 application topically 3 (three) times daily as needed (for pain).      . Multiple Vitamin (MULTIVITAMIN) tablet Take 1 tablet by mouth daily.        . sodium chloride (OCEAN) 0.65 % nasal spray Place 2 sprays into the nose 2 (two) times daily.      . vitamin C (ASCORBIC ACID) 500 MG tablet Take 500 mg by mouth daily.      . vitamin E 400 UNIT capsule Take 400 Units by mouth daily.      . [DISCONTINUED] diltiazem (DILACOR XR) 180 MG 24 hr capsule Take 1 capsule (180 mg total) by mouth daily.  30 capsule  6   No current facility-administered medications for this visit.    Allergies:   Review of patient's allergies indicates no known allergies.   Social History:  The patient  reports that he has never smoked. He does not have any smokeless tobacco history on file. He reports that he does not drink alcohol or use illicit drugs.   Family History:  The patient's family history includes Dementia (age of onset: 21) in his father; Other (age of onset: 50) in his mother. There is no history of Coronary artery disease.  ROS:  Please see the history of present illness. All other systems reviewed and negative.   PHYSICAL EXAM:  VS:  BP 150/64  Pulse 54  Ht 5\' 7"  (1.702 m)  Wt 187 lb (84.823 kg)  BMI 29.28 kg/m2 Well nourished, well developed WM, well-appearing, in no acute distress HEENT: normal Neck: no JVD Cardiac:  normal S1, S2; RRR; no murmur Lungs:  clear to auscultation bilaterally, no wheezing, rhonchi or rales Abd: soft, nontender, no hepatomegaly Ext: no edema Skin: warm and dry Neuro:  moves all extremities spontaneously, no focal abnormalities noted  EKG:  Sinus bradycardia 54bpm otherwise normal EKG, QTc 438     ASSESSMENT AND PLAN:  1. PAF - doing well. Per d/w Dr. Lovena Le, continue amiodarone 400mg  daily until he is seen in f/u in 3 months.  Will check baseline PFTs. Recent thyroid function and CMET unremarkable recently. Will defer timing of further monitoring to primary cardiologist. Will refer him to our anticoag clinic for periodic med management monitoring visit while on NOAC. 2. HTN - he is not on any agents. BP here in the office was 150/64. He reports upper 140's, occasionally 150's at home and is nervous about this. HR prohibits BB or diltiazem. Will use low dose amlodipine 2.5mg  daily since BP was normal in the hospital (although he was on diltiazem right before those readings). We can titrate as needed. 3. Hyperglycemia - h/o pre-DM. Follows with Dr. Minna Antis for this. We discussed low sugar diet and limiting simple carbs. 4. CKD stage II - very mild renal  impairment by GFR.  Dispo: F/u 3 months with Dr. Lovena Le.  Signed, Melina Copa, PA-C  01/10/2014 10:56 AM

## 2014-01-10 ENCOUNTER — Encounter: Payer: Self-pay | Admitting: Physician Assistant

## 2014-01-10 ENCOUNTER — Ambulatory Visit (INDEPENDENT_AMBULATORY_CARE_PROVIDER_SITE_OTHER): Payer: PRIVATE HEALTH INSURANCE | Admitting: Physician Assistant

## 2014-01-10 VITALS — BP 150/64 | HR 54 | Ht 67.0 in | Wt 187.0 lb

## 2014-01-10 DIAGNOSIS — N182 Chronic kidney disease, stage 2 (mild): Secondary | ICD-10-CM

## 2014-01-10 DIAGNOSIS — I4891 Unspecified atrial fibrillation: Secondary | ICD-10-CM

## 2014-01-10 DIAGNOSIS — I10 Essential (primary) hypertension: Secondary | ICD-10-CM

## 2014-01-10 DIAGNOSIS — Z79899 Other long term (current) drug therapy: Secondary | ICD-10-CM

## 2014-01-10 DIAGNOSIS — R7309 Other abnormal glucose: Secondary | ICD-10-CM | POA: Diagnosis not present

## 2014-01-10 DIAGNOSIS — R739 Hyperglycemia, unspecified: Secondary | ICD-10-CM

## 2014-01-10 DIAGNOSIS — I480 Paroxysmal atrial fibrillation: Secondary | ICD-10-CM

## 2014-01-10 MED ORDER — AMLODIPINE BESYLATE 2.5 MG PO TABS: 2.5000 mg | ORAL_TABLET | Freq: Every day | ORAL | Status: DC

## 2014-01-10 NOTE — Patient Instructions (Addendum)
Your physician has recommended that you have a pulmonary function test. Pulmonary Function Tests are a group of tests that measure how well air moves in and out of your lungs. [place]  [facility] [place], [place]   Your physician has recommended you make the following change in your medication:  1. START AMLODIPINE 2.5 MG 1 TABLET DAILY   You have been referred to [place]  Your physician recommends that you schedule a follow-up appointment in: [place] DR. Lovena Le

## 2014-01-17 ENCOUNTER — Ambulatory Visit: Payer: PRIVATE HEALTH INSURANCE | Admitting: Pharmacist

## 2014-01-17 ENCOUNTER — Ambulatory Visit (INDEPENDENT_AMBULATORY_CARE_PROVIDER_SITE_OTHER): Payer: PRIVATE HEALTH INSURANCE | Admitting: Pharmacist

## 2014-01-17 NOTE — Progress Notes (Signed)
Pt was started on Pradaxa a few years ago for Afib.  Reviewed patients medication list.  Pt is not currently on any combined P-gp and strong CYP3A4 inhibitors/inducers (ketoconazole, traconazole, ritonavir, carbamazepine, phenytoin, rifampin, St. John's wort).  Reviewed labs.  SCr 0.87, Weight 85kg, CrCl- ~70 mL/min.  Dose appropriate based on CrCl.   Hgb and HCT Within Normal Limits  A full discussion of the nature of anticoagulants has been carried out.  A benefit/risk analysis has been presented to the patient, so that they understand the justification for choosing anticoagulation with Praxada at this time.  The need for compliance is stressed.  Pt is aware to take the medication twice daily.  Side effects of potential bleeding are discussed, including unusual colored urine or stools, coughing up blood or coffee ground emesis, nose bleeds or serious fall or head trauma.  Discussed signs and symptoms of stroke. The patient should avoid any OTC items containing aspirin or ibuprofen.  Avoid alcohol consumption.   Call if any signs of abnormal bleeding.  Discussed financial obligations and resolved any difficulty in obtaining medication.  Asked pt to call MD if any medications change or he is scheduled for a procedure. Next lab test test in 6 months. Pt has 18mo f/u visit scheduled with Dr. Lovena Le late NovDrucie Opitz, PharmD Clinical Pharmacy Resident Pager: [phone] 01/17/2014 9:44 AM

## 2014-01-21 ENCOUNTER — Ambulatory Visit (INDEPENDENT_AMBULATORY_CARE_PROVIDER_SITE_OTHER): Payer: PRIVATE HEALTH INSURANCE | Admitting: Internal Medicine

## 2014-01-21 ENCOUNTER — Encounter (INDEPENDENT_AMBULATORY_CARE_PROVIDER_SITE_OTHER): Payer: Self-pay

## 2014-01-21 DIAGNOSIS — Z79899 Other long term (current) drug therapy: Secondary | ICD-10-CM | POA: Diagnosis not present

## 2014-01-21 LAB — PULMONARY FUNCTION TEST
DL/VA % pred: 107 %
DL/VA: 4.63 ml/min/mmHg/L
DLCO unc % pred: 110 %
DLCO unc: 29.81 ml/min/mmHg
FEF 25-75 Post: 2.56 L/sec
FEF 25-75 Pre: 2.07 L/sec
FEF2575-%Change-Post: 23 %
FEF2575-%PRED-POST: 162 %
FEF2575-%Pred-Pre: 132 %
FEV1-%CHANGE-POST: 4 %
FEV1-%PRED-POST: 137 %
FEV1-%Pred-Pre: 130 %
FEV1-PRE: 3.06 L
FEV1-Post: 3.21 L
FEV1FVC-%Change-Post: 4 %
FEV1FVC-%Pred-Pre: 101 %
FEV6-%Change-Post: 0 %
FEV6-%PRED-PRE: 134 %
FEV6-%Pred-Post: 135 %
FEV6-POST: 4.2 L
FEV6-PRE: 4.16 L
FEV6FVC-%Change-Post: 0 %
FEV6FVC-%PRED-POST: 106 %
FEV6FVC-%PRED-PRE: 105 %
FVC-%Change-Post: 0 %
FVC-%PRED-PRE: 126 %
FVC-%Pred-Post: 126 %
FVC-POST: 4.23 L
FVC-PRE: 4.23 L
PRE FEV6/FVC RATIO: 98 %
Post FEV1/FVC ratio: 76 %
Post FEV6/FVC ratio: 99 %
Pre FEV1/FVC ratio: 72 %
RV % PRED: 86 %
RV: 2.11 L
TLC % pred: 105 %
TLC: 6.61 L

## 2014-01-21 NOTE — Progress Notes (Signed)
PFT done today. 

## 2014-01-22 ENCOUNTER — Encounter: Payer: PRIVATE HEALTH INSURANCE | Admitting: Internal Medicine

## 2014-02-07 ENCOUNTER — Other Ambulatory Visit: Payer: Self-pay | Admitting: *Deleted

## 2014-02-07 DIAGNOSIS — M12819 Other specific arthropathies, not elsewhere classified, unspecified shoulder: Secondary | ICD-10-CM | POA: Diagnosis not present

## 2014-02-07 MED ORDER — AMIODARONE HCL 200 MG PO TABS: 400.0000 mg | ORAL_TABLET | Freq: Every day | ORAL | Status: DC

## 2014-03-06 DIAGNOSIS — M545 Low back pain: Secondary | ICD-10-CM | POA: Diagnosis not present

## 2014-03-06 DIAGNOSIS — G894 Chronic pain syndrome: Secondary | ICD-10-CM | POA: Diagnosis not present

## 2014-03-06 DIAGNOSIS — M12819 Other specific arthropathies, not elsewhere classified, unspecified shoulder: Secondary | ICD-10-CM | POA: Diagnosis not present

## 2014-03-27 ENCOUNTER — Encounter: Payer: Self-pay | Admitting: Internal Medicine

## 2014-03-27 ENCOUNTER — Ambulatory Visit (INDEPENDENT_AMBULATORY_CARE_PROVIDER_SITE_OTHER): Payer: PRIVATE HEALTH INSURANCE | Admitting: Internal Medicine

## 2014-03-27 VITALS — BP 126/58 | HR 57 | Ht 66.0 in | Wt 192.6 lb

## 2014-03-27 DIAGNOSIS — I480 Paroxysmal atrial fibrillation: Secondary | ICD-10-CM | POA: Diagnosis not present

## 2014-03-27 DIAGNOSIS — I10 Essential (primary) hypertension: Secondary | ICD-10-CM

## 2014-03-27 NOTE — Assessment & Plan Note (Signed)
He is maintaining sinus rhythm very nicely on amiodarone. His dose will be decreased today from 400 mg daily down to 200 mg daily. No other change in medications. I'll see him back in 6 months.

## 2014-03-27 NOTE — Assessment & Plan Note (Signed)
He has had no symptomatic sinus node dysfunction. No change in medications except as previously noted.

## 2014-03-27 NOTE — Progress Notes (Signed)
HPI William Stein returns today for followup. He is a very pleasant 80 year old man with a history of atrial fibrillation, sinus bradycardia, and hypertension. He has insomnia. He works the night shift. The patient has had no symptomatic atrial fibrillation. He was switched from flecainide to amiodarone and has been taking 400 mg daily. No symptomatic bradycardia recently. He has had problems with depression, as a good friend has recently committed suicide, and his daughter and he have had difficult relations.   No Known Allergies   Current Outpatient Prescriptions  Medication Sig Dispense Refill  . amiodarone (PACERONE) 200 MG tablet Take 2 tablets (400 mg total) by mouth daily. 60 tablet 11  . amLODipine (NORVASC) 2.5 MG tablet Take 1 tablet (2.5 mg total) by mouth daily. 30 tablet 6  . Cholecalciferol (VITAMIN D-3) 1000 UNITS CAPS Take 1,000 Units by mouth daily.     . dabigatran (PRADAXA) 150 MG CAPS Take 150 mg by mouth 2 (two) times daily.      Marland Kitchen esomeprazole (NEXIUM) 40 MG capsule Take 40 mg by mouth daily at 12 noon.    . Menthol, Topical Analgesic, (BIOFREEZE EX) Apply 1 application topically 3 (three) times daily as needed (for pain).    . Multiple Vitamin (MULTIVITAMIN) tablet Take 1 tablet by mouth daily.      . sodium chloride (OCEAN) 0.65 % nasal spray Place 2 sprays into the nose 2 (two) times daily.    . vitamin C (ASCORBIC ACID) 500 MG tablet Take 500 mg by mouth daily.    . vitamin E 400 UNIT capsule Take 400 Units by mouth daily.    . [DISCONTINUED] diltiazem (DILACOR XR) 180 MG 24 hr capsule Take 1 capsule (180 mg total) by mouth daily. 30 capsule 6   No current facility-administered medications for this visit.     Past Medical History  Diagnosis Date  . Bradycardia     a. during 2009, 2/2 combination of diltiazem and BB  . PAF (paroxysmal atrial fibrillation) 2007    a. Previously on flecainide. b. 12/2013: s/p DCCV, started on amio. AC w/ pradaxa.  Marland Kitchen HTN  (hypertension)   . HLD (hyperlipidemia)   . GERD (gastroesophageal reflux disease)   . Noncompliance     a. Prior issue - pt more recently endorses compliance with meds.  . CKD (chronic kidney disease), stage II     ROS:   All systems reviewed and negative except as noted in the HPI.   Past Surgical History  Procedure Laterality Date  . Doppler echocardiography  2011  . Exercise stress test  2012  . Colonoscopy  2003  . Cardiac catheterization  2009  . Cardioversion N/A 12/18/2013    Procedure: CARDIOVERSION;  Surgeon: Lelon Perla, MD;  Location: [facility] ENDOSCOPY;  Service: Cardiovascular;  Laterality: N/A;     Family History  Problem Relation Age of Onset  . Dementia Father 9  . Other Mother 25    old age  . Coronary artery disease Neg Hx      History   Social History  . Marital Status: Widowed    Spouse Name: N/A    Number of Children: N/A  . Years of Education: N/A   Occupational History  . Full time [employer]   Social History Main Topics  . Smoking status: Never Smoker   . Smokeless tobacco: Not on file  . Alcohol Use: No  . Drug Use: No  . Sexual Activity: Not on file  Other Topics Concern  . Not on file   Social History Narrative   Widowed      BP 126/58 mmHg  Pulse 57  Ht 5\' 6"  (1.676 m)  Wt 192 lb 9.6 oz (87.363 kg)  BMI 31.10 kg/m2  Physical Exam:  Well appearing 80 year old man, NAD HEENT: Unremarkable Neck:  No JVD, no thyromegally Lungs:  Clear with no wheezes, rales, or rhonchi. HEART:  Regular rate rhythm, no murmurs, no rubs, no clicks Abd:  soft, positive bowel sounds, no organomegally, no rebound, no guarding Ext:  2 plus pulses, no edema, no cyanosis, no clubbing Skin:  No rashes no nodules Neuro:  CN II through XII intact, motor grossly intact  EKG Normal sinus rhythm.  Assess/Plan:

## 2014-03-27 NOTE — Patient Instructions (Signed)
Your physician has recommended you make the following change in your medication:  1) Decrease amiodarone to 200 mg once daily  Your physician wants you to follow-up in: 6 months with Dr. Lovena Le. You will receive a reminder letter in the mail two months in advance. If you don't receive a letter, please call our office to schedule the follow-up appointment.

## 2014-03-27 NOTE — Assessment & Plan Note (Signed)
His blood pressure is well controlled today. No change in medical therapy. He will maintain a low-sodium diet.

## 2014-04-03 ENCOUNTER — Encounter: Payer: Self-pay | Admitting: Internal Medicine

## 2014-04-03 DIAGNOSIS — I10 Essential (primary) hypertension: Secondary | ICD-10-CM | POA: Diagnosis not present

## 2014-04-03 DIAGNOSIS — I4891 Unspecified atrial fibrillation: Secondary | ICD-10-CM | POA: Diagnosis not present

## 2014-04-09 ENCOUNTER — Ambulatory Visit: Payer: PRIVATE HEALTH INSURANCE | Admitting: Internal Medicine

## 2014-04-17 DIAGNOSIS — F419 Anxiety disorder, unspecified: Secondary | ICD-10-CM | POA: Diagnosis not present

## 2014-04-17 DIAGNOSIS — I480 Paroxysmal atrial fibrillation: Secondary | ICD-10-CM | POA: Diagnosis not present

## 2014-04-17 DIAGNOSIS — K219 Gastro-esophageal reflux disease without esophagitis: Secondary | ICD-10-CM | POA: Diagnosis not present

## 2014-04-17 DIAGNOSIS — Z23 Encounter for immunization: Secondary | ICD-10-CM | POA: Diagnosis not present

## 2014-04-17 DIAGNOSIS — N529 Male erectile dysfunction, unspecified: Secondary | ICD-10-CM | POA: Diagnosis not present

## 2014-05-30 ENCOUNTER — Other Ambulatory Visit: Payer: Self-pay | Admitting: Dermatology

## 2014-05-30 DIAGNOSIS — L814 Other melanin hyperpigmentation: Secondary | ICD-10-CM | POA: Diagnosis not present

## 2014-05-30 DIAGNOSIS — L820 Inflamed seborrheic keratosis: Secondary | ICD-10-CM | POA: Diagnosis not present

## 2014-05-30 DIAGNOSIS — D045 Carcinoma in situ of skin of trunk: Secondary | ICD-10-CM | POA: Diagnosis not present

## 2014-05-30 DIAGNOSIS — D692 Other nonthrombocytopenic purpura: Secondary | ICD-10-CM | POA: Diagnosis not present

## 2014-05-30 DIAGNOSIS — L570 Actinic keratosis: Secondary | ICD-10-CM | POA: Diagnosis not present

## 2014-05-30 DIAGNOSIS — D485 Neoplasm of uncertain behavior of skin: Secondary | ICD-10-CM | POA: Diagnosis not present

## 2014-05-30 DIAGNOSIS — Z85828 Personal history of other malignant neoplasm of skin: Secondary | ICD-10-CM | POA: Diagnosis not present

## 2014-05-30 DIAGNOSIS — D1801 Hemangioma of skin and subcutaneous tissue: Secondary | ICD-10-CM | POA: Diagnosis not present

## 2014-06-29 ENCOUNTER — Emergency Department (HOSPITAL_COMMUNITY)
Admission: EM | Admit: 2014-06-29 | Discharge: 2014-06-29 | Disposition: A | Discharge status: Home / Self Care | Payer: PRIVATE HEALTH INSURANCE | Attending: Emergency Medicine | Admitting: Emergency Medicine

## 2014-06-29 ENCOUNTER — Encounter (HOSPITAL_COMMUNITY): Payer: Self-pay | Admitting: Emergency Medicine

## 2014-06-29 ENCOUNTER — Emergency Department (HOSPITAL_COMMUNITY): Payer: PRIVATE HEALTH INSURANCE

## 2014-06-29 DIAGNOSIS — E785 Hyperlipidemia, unspecified: Secondary | ICD-10-CM | POA: Diagnosis not present

## 2014-06-29 DIAGNOSIS — Z79899 Other long term (current) drug therapy: Secondary | ICD-10-CM | POA: Diagnosis not present

## 2014-06-29 DIAGNOSIS — K219 Gastro-esophageal reflux disease without esophagitis: Secondary | ICD-10-CM | POA: Diagnosis not present

## 2014-06-29 DIAGNOSIS — Z9889 Other specified postprocedural states: Secondary | ICD-10-CM | POA: Insufficient documentation

## 2014-06-29 DIAGNOSIS — Y998 Other external cause status: Secondary | ICD-10-CM | POA: Insufficient documentation

## 2014-06-29 DIAGNOSIS — S2232XA Fracture of one rib, left side, initial encounter for closed fracture: Secondary | ICD-10-CM | POA: Diagnosis not present

## 2014-06-29 DIAGNOSIS — I129 Hypertensive chronic kidney disease with stage 1 through stage 4 chronic kidney disease, or unspecified chronic kidney disease: Secondary | ICD-10-CM | POA: Diagnosis not present

## 2014-06-29 DIAGNOSIS — Y9389 Activity, other specified: Secondary | ICD-10-CM | POA: Insufficient documentation

## 2014-06-29 DIAGNOSIS — Z9119 Patient's noncompliance with other medical treatment and regimen: Secondary | ICD-10-CM | POA: Diagnosis not present

## 2014-06-29 DIAGNOSIS — R0781 Pleurodynia: Secondary | ICD-10-CM | POA: Diagnosis not present

## 2014-06-29 DIAGNOSIS — W06XXXA Fall from bed, initial encounter: Secondary | ICD-10-CM | POA: Insufficient documentation

## 2014-06-29 DIAGNOSIS — N182 Chronic kidney disease, stage 2 (mild): Secondary | ICD-10-CM | POA: Diagnosis not present

## 2014-06-29 DIAGNOSIS — Y92092 Bedroom in other non-institutional residence as the place of occurrence of the external cause: Secondary | ICD-10-CM | POA: Diagnosis not present

## 2014-06-29 DIAGNOSIS — I480 Paroxysmal atrial fibrillation: Secondary | ICD-10-CM | POA: Diagnosis not present

## 2014-06-29 DIAGNOSIS — S299XXA Unspecified injury of thorax, initial encounter: Secondary | ICD-10-CM | POA: Diagnosis present

## 2014-06-29 MED ORDER — HYDROCODONE-ACETAMINOPHEN 5-325 MG PO TABS
1.0000 | ORAL_TABLET | Freq: Four times a day (QID) | ORAL | Status: DC | PRN
Start: 2014-06-29 — End: 2014-09-26

## 2014-06-29 NOTE — Discharge Instructions (Signed)
Blunt Chest Trauma Blunt chest trauma is an injury caused by a blow to the chest. These chest injuries can be very painful. Blunt chest trauma often results in bruised or broken (fractured) ribs. Most cases of bruised and fractured ribs from blunt chest traumas get better after 1 to 3 weeks of rest and pain medicine. Often, the soft tissue in the chest wall is also injured, causing pain and bruising. Internal organs, such as the heart and lungs, may also be injured. Blunt chest trauma can lead to serious medical problems. This injury requires immediate medical care. CAUSES   Motor vehicle collisions.  Falls.  Physical violence.  Sports injuries. SYMPTOMS   Chest pain. The pain may be worse when you move or breathe deeply.  Shortness of breath.  Lightheadedness.  Bruising.  Tenderness.  Swelling. DIAGNOSIS  Your caregiver will do a physical exam. X-rays may be taken to look for fractures. However, minor rib fractures may not show up on X-rays until a few days after the injury. If a more serious injury is suspected, further imaging tests may be done. This may include ultrasounds, computed tomography (CT) scans, or magnetic resonance imaging (MRI). TREATMENT  Treatment depends on the severity of your injury. Your caregiver may prescribe pain medicines and deep breathing exercises. HOME CARE INSTRUCTIONS 1. Limit your activities until you can move around without much pain. 2. Do not do any strenuous work until your injury is healed. 3. Put ice on the injured area. 1. Put ice in a plastic bag. 2. Place a towel between your skin and the bag. 3. Leave the ice on for 15-20 minutes, 03-04 times a day. 4. You may wear a rib belt as directed by your caregiver to reduce pain. 5. Practice deep breathing as directed by your caregiver to keep your lungs clear. 6. Only take over-the-counter or prescription medicines for pain, fever, or discomfort as directed by your caregiver. SEEK IMMEDIATE  MEDICAL CARE IF:   You have increasing pain or shortness of breath.  You cough up blood.  You have nausea, vomiting, or abdominal pain.  You have a fever.  You feel dizzy, weak, or you faint. MAKE SURE YOU:  Understand these instructions.  Will watch your condition.  Will get help right away if you are not doing well or get worse. Document Released: 05/27/2004 Document Revised: 07/12/2011 Document Reviewed: 02/03/2011 [facility] Patient Information 2015 [place], [place]. This information is not intended to replace advice given to you by your health care provider. Make sure you discuss any questions you have with your health care provider.     Rib Fracture A rib fracture is a break or crack in one of the bones of the ribs. The ribs are a group of long, curved bones that wrap around your chest and attach to your spine. They protect your lungs and other organs in the chest cavity. A broken or cracked rib is often painful, but most do not cause other problems. Most rib fractures heal on their own over time. However, rib fractures can be more serious if multiple ribs are broken or if broken ribs move out of place and push against other structures. CAUSES   A direct blow to the chest. For example, this could happen during contact sports, a car accident, or a fall against a hard object.  Repetitive movements with high force, such as pitching a baseball or having severe coughing spells. SYMPTOMS   Pain when you breathe in or cough.  Pain when  someone presses on the injured area. DIAGNOSIS  Your caregiver will perform a physical exam. Various imaging tests may be ordered to confirm the diagnosis and to look for related injuries. These tests may include a chest X-ray, computed tomography (CT), magnetic resonance imaging (MRI), or a bone scan. TREATMENT  Rib fractures usually heal on their own in 1-3 months. The longer healing period is often associated with a continued cough or other  aggravating activities. During the healing period, pain control is very important. Medication is usually given to control pain. Hospitalization or surgery may be needed for more severe injuries, such as those in which multiple ribs are broken or the ribs have moved out of place.  HOME CARE INSTRUCTIONS  7. Avoid strenuous activity and any activities or movements that cause pain. Be careful during activities and avoid bumping the injured rib. 8. Gradually increase activity as directed by your caregiver. 9. Only take over-the-counter or prescription medications as directed by your caregiver. Do not take other medications without asking your caregiver first. 10. Apply ice to the injured area for the first 1-2 days after you have been treated or as directed by your caregiver. Applying ice helps to reduce inflammation and pain. 1. Put ice in a plastic bag. 2. Place a towel between your skin and the bag.  3. Leave the ice on for 15-20 minutes at a time, every 2 hours while you are awake. 11. Perform deep breathing as directed by your caregiver. This will help prevent pneumonia, which is a common complication of a broken rib. Your caregiver may instruct you to: 1. Take deep breaths several times a day. 2. Try to cough several times a day, holding a pillow against the injured area. 3. Use a device called an incentive spirometer to practice deep breathing several times a day. 12. Drink enough fluids to keep your urine clear or pale yellow. This will help you avoid constipation.  13. Do not wear a rib belt or binder. These restrict breathing, which can lead to pneumonia.  SEEK IMMEDIATE MEDICAL CARE IF:   You have a fever.   You have difficulty breathing or shortness of breath.   You develop a continual cough, or you cough up thick or bloody sputum.  You feel sick to your stomach (nausea), throw up (vomit), or have abdominal pain.   You have worsening pain not controlled with medications.   MAKE SURE YOU:  Understand these instructions.  Will watch your condition.  Will get help right away if you are not doing well or get worse. Document Released: 04/19/2005 Document Revised: 12/20/2012 Document Reviewed: 06/21/2012 [facility] Patient Information 2015 [place], [place]. This information is not intended to replace advice given to you by your health care provider. Make sure you discuss any questions you have with your health care provider.    Incentive Spirometer An incentive spirometer is a tool that can help keep your lungs clear and active. This tool measures how well you are filling your lungs with each breath. Taking long, deep breaths may help reverse or decrease the chance of developing breathing (pulmonary) problems (especially infection) following:  Surgery of the chest or abdomen.  Surgery if you have a history of smoking or a lung problem.  A long period of time when you are unable to move or be active. BEFORE THE PROCEDURE   If the spirometer includes an indicator to show your best effort, your nurse or respiratory therapist will set it to a desired goal.  If possible, sit up straight or lean slightly forward. Try not to slouch.  Hold the incentive spirometer in an upright position. INSTRUCTIONS FOR USE  14. Sit on the edge of your bed if possible, or sit up as far as you can in bed or on a chair. 15. Hold the incentive spirometer in an upright position. 16. Breathe out normally. 17. Place the mouthpiece in your mouth and seal your lips tightly around it. 18. Breathe in slowly and as deeply as possible, raising the piston or the ball toward the top of the column. 19. Hold your breath for 3-5 seconds or for as long as possible. Allow the piston or ball to fall to the bottom of the column. 20. Remove the mouthpiece from your mouth and breathe out normally. 21. Rest for a few seconds and repeat Steps 1 through 7 at least 10 times every 1-2 hours when you are  awake. Take your time and take a few normal breaths between deep breaths. 22. The spirometer may include an indicator to show your best effort. Use the indicator as a goal to work toward during each repetition. 23. After each set of 10 deep breaths, practice coughing to be sure your lungs are clear. If you have an incision (the cut made at the time of surgery), support your incision when coughing by placing a pillow or rolled-up towels firmly against it. Once you are able to get out of bed, walk around indoors and cough well. You may stop using the incentive spirometer when instructed by your caregiver.  RISKS AND COMPLICATIONS  Breathing too quickly may cause dizziness. At an extreme, this could cause you to pass out. Take your time so you do not get dizzy or light-headed.  If you are in pain, you may need to take or ask for pain medication before doing incentive spirometry. It is harder to take a deep breath if you are having pain. AFTER USE  Rest and breathe slowly and easily.  It can be helpful to keep a log of your progress. Your caregiver can provide you with a simple table to help with this. If you are using the spirometer at home, follow these instructions: [place] IF:   You are having difficultly using the spirometer.  You have trouble using the spirometer as often as instructed.  Your pain medication is not giving enough relief while using the spirometer.  You develop fever of 100.59F (38.1C) or higher. SEEK IMMEDIATE MEDICAL CARE IF:   You cough up bloody sputum that had not been present before.  You develop fever of 102F (38.9C) or greater.  You develop worsening pain at or near the incision site. MAKE SURE YOU:   Understand these instructions.  Will watch your condition.  Will get help right away if you are not doing well or get worse. Document Released: 08/30/2006 Document Revised: 09/03/2013 Document Reviewed: 10/31/2006 [facility] Patient Information  2015 [place], [place]. This information is not intended to replace advice given to you by your health care provider. Make sure you discuss any questions you have with your health care provider.

## 2014-06-29 NOTE — ED Notes (Signed)
Pt arrived to ED POV with c/o left sided rib pain that started Wednesday after he fell from bed while sleeping. Denies hitting head. Pt has bruising to left side of rib cage. Pain increases with coughing, moving and inspiration.

## 2014-06-29 NOTE — ED Notes (Signed)
Pt given incentive spirometer and taught how to use it. Pt returned demonstration.

## 2014-06-29 NOTE — ED Provider Notes (Signed)
CSN: [id number]     Arrival date & time 06/29/14  3295 History   First MD Initiated Contact with Patient 06/29/14 0932     Chief Complaint  Patient presents with  . Rib Injury     (Consider location/radiation/quality/duration/timing/severity/associated sxs/prior Treatment) HPI  81 year old male presents 3 days after falling out of bed and landing on his left side. He has been taking tylenol with no relief. Pain 10/10 with movement, 0/10 while at rest. Hurts more with movement, twisting, inspiration, and cough. Did not hit head, no headache or weakness. No fevers or chills. No dyspnea.   Past Medical History  Diagnosis Date  . Bradycardia     a. during 2009, 2/2 combination of diltiazem and BB  . PAF (paroxysmal atrial fibrillation) 2007    a. Previously on flecainide. b. 12/2013: s/p DCCV, started on amio. AC w/ pradaxa.  Marland Kitchen HTN (hypertension)   . HLD (hyperlipidemia)   . GERD (gastroesophageal reflux disease)   . Noncompliance     a. Prior issue - pt more recently endorses compliance with meds.  . CKD (chronic kidney disease), stage II    Past Surgical History  Procedure Laterality Date  . Doppler echocardiography  2011  . Exercise stress test  2012  . Colonoscopy  2003  . Cardiac catheterization  2009  . Cardioversion N/A 12/18/2013    Procedure: CARDIOVERSION;  Surgeon: Lelon Perla, MD;  Location: [facility] ENDOSCOPY;  Service: Cardiovascular;  Laterality: N/A;   Family History  Problem Relation Age of Onset  . Dementia Father 14  . Other Mother 17    old age  . Coronary artery disease Neg Hx    History  Substance Use Topics  . Smoking status: Never Smoker   . Smokeless tobacco: Not on file  . Alcohol Use: No    Review of Systems  Respiratory: Positive for cough. Negative for shortness of breath.   Cardiovascular: Positive for chest pain.  Gastrointestinal: Negative for vomiting.  Neurological: Negative for headaches.  All other systems reviewed and are  negative.     Allergies  Review of patient's allergies indicates no known allergies.  Home Medications   Prior to Admission medications   Medication Sig Start Date End Date Taking? Authorizing Provider  amiodarone (PACERONE) 200 MG tablet Take 1 tablet (200 mg total) by mouth daily. 03/27/14   Evans Lance, MD  amLODipine (NORVASC) 2.5 MG tablet Take 1 tablet (2.5 mg total) by mouth daily. 01/10/14   Dayna N Dunn, PA-C  Cholecalciferol (VITAMIN D-3) 1000 UNITS CAPS Take 1,000 Units by mouth daily.     Historical Provider, MD  dabigatran (PRADAXA) 150 MG CAPS Take 150 mg by mouth 2 (two) times daily.      Historical Provider, MD  esomeprazole (NEXIUM) 40 MG capsule Take 40 mg by mouth daily at 12 noon.    Historical Provider, MD  Menthol, Topical Analgesic, (BIOFREEZE EX) Apply 1 application topically 3 (three) times daily as needed (for pain).    Historical Provider, MD  Multiple Vitamin (MULTIVITAMIN) tablet Take 1 tablet by mouth daily.      Historical Provider, MD  sodium chloride (OCEAN) 0.65 % nasal spray Place 2 sprays into the nose 2 (two) times daily.    Historical Provider, MD  vitamin C (ASCORBIC ACID) 500 MG tablet Take 500 mg by mouth daily.    Historical Provider, MD  vitamin E 400 UNIT capsule Take 400 Units by mouth daily.  Historical Provider, MD   BP 135/62 mmHg  Pulse 59  Temp(Src) 98 F (36.7 C) (Oral)  Resp 16  SpO2 100% Physical Exam  Constitutional: He is oriented to person, place, and time. He appears well-developed and well-nourished. No distress.  HENT:  Head: Normocephalic and atraumatic.  Right Ear: External ear normal.  Left Ear: External ear normal.  Nose: Nose normal.  Eyes: Right eye exhibits no discharge. Left eye exhibits no discharge.  Neck: Neck supple.  Cardiovascular: Normal rate, regular rhythm, normal heart sounds and intact distal pulses.   Pulmonary/Chest: Effort normal and breath sounds normal.   He exhibits tenderness.     Abdominal: Soft. He exhibits no distension. There is no tenderness.  Musculoskeletal: He exhibits no edema.  Neurological: He is alert and oriented to person, place, and time.  Skin: Skin is warm and dry. He is not diaphoretic.  Nursing note and vitals reviewed.   ED Course  Procedures (including critical care time) Labs Review Labs Reviewed - No data to display  Imaging Review Dg Ribs Unilateral W/chest Left  06/29/2014   CLINICAL DATA:  Acute left-sided rib pain after falling from bed 3 days ago. Initial encounter.  EXAM: LEFT RIBS AND CHEST - 3+ VIEW  COMPARISON:  December 17, 2013.  FINDINGS: Deformity of anterior portion of left seventh rib is noted consistent with fracture of indeterminate age. There is no evidence of pneumothorax or pleural effusion. Both lungs are clear. Heart size and mediastinal contours are within normal limits.  IMPRESSION: Fracture of anterior portion of left seventh rib of indeterminate age.   Electronically Signed   By: Marijo Conception, M.D.   On: 06/29/2014 11:02     EKG Interpretation None      MDM   Final diagnoses:  Left rib fracture, closed, initial encounter    Patient with at least one rib fracture as above. Patient does not appear in severe pain in the second file is amenable to outpatient pain control. He took Tylenol just prior to arrival and is driving and thus was not given any pain meds in the ER. He'll be discharged with Norco with strict instructions on use and was cautioned on side effects. He does not have any trouble breathing at this time and no hypoxia. He did not hit his head and this has been 3 days ago so I highly doubt an occult head injury despite him being on blood thinners. Discussed incentive spirometry use as well as return precautions.    Ephraim Hamburger, MD 06/29/14 [phone]

## 2014-07-04 DIAGNOSIS — S2239XA Fracture of one rib, unspecified side, initial encounter for closed fracture: Secondary | ICD-10-CM | POA: Diagnosis not present

## 2014-07-04 DIAGNOSIS — R079 Chest pain, unspecified: Secondary | ICD-10-CM | POA: Diagnosis not present

## 2014-08-15 DIAGNOSIS — Z0000 Encounter for general adult medical examination without abnormal findings: Secondary | ICD-10-CM | POA: Diagnosis not present

## 2014-08-15 DIAGNOSIS — I4891 Unspecified atrial fibrillation: Secondary | ICD-10-CM | POA: Diagnosis not present

## 2014-08-15 DIAGNOSIS — I10 Essential (primary) hypertension: Secondary | ICD-10-CM | POA: Diagnosis not present

## 2014-09-05 DIAGNOSIS — H5203 Hypermetropia, bilateral: Secondary | ICD-10-CM | POA: Diagnosis not present

## 2014-09-05 DIAGNOSIS — H25813 Combined forms of age-related cataract, bilateral: Secondary | ICD-10-CM | POA: Diagnosis not present

## 2014-09-05 DIAGNOSIS — H04123 Dry eye syndrome of bilateral lacrimal glands: Secondary | ICD-10-CM | POA: Diagnosis not present

## 2014-09-05 DIAGNOSIS — H524 Presbyopia: Secondary | ICD-10-CM | POA: Diagnosis not present

## 2014-09-24 DIAGNOSIS — H25811 Combined forms of age-related cataract, right eye: Secondary | ICD-10-CM | POA: Diagnosis not present

## 2014-09-24 DIAGNOSIS — H2511 Age-related nuclear cataract, right eye: Secondary | ICD-10-CM | POA: Diagnosis not present

## 2014-09-24 DIAGNOSIS — H25011 Cortical age-related cataract, right eye: Secondary | ICD-10-CM | POA: Diagnosis not present

## 2014-09-26 ENCOUNTER — Ambulatory Visit (INDEPENDENT_AMBULATORY_CARE_PROVIDER_SITE_OTHER): Payer: PRIVATE HEALTH INSURANCE | Admitting: Internal Medicine

## 2014-09-26 ENCOUNTER — Encounter: Payer: Self-pay | Admitting: Internal Medicine

## 2014-09-26 VITALS — BP 134/60 | HR 54 | Ht 66.0 in | Wt 193.8 lb

## 2014-09-26 DIAGNOSIS — I480 Paroxysmal atrial fibrillation: Secondary | ICD-10-CM

## 2014-09-26 DIAGNOSIS — Z9119 Patient's noncompliance with other medical treatment and regimen: Secondary | ICD-10-CM

## 2014-09-26 DIAGNOSIS — Z91199 Patient's noncompliance with other medical treatment and regimen due to unspecified reason: Secondary | ICD-10-CM

## 2014-09-26 NOTE — Assessment & Plan Note (Signed)
He is maintaining NSR very nicely. We will have him reduce his dose of amio to 1.2 grams a week.

## 2014-09-26 NOTE — Patient Instructions (Signed)
Medication Instructions:  Your physician has recommended you make the following change in your medication:  1) Decrease Amiodarone to 1 tablet Mon- Fri and 1/2 tablet Sat and Sun   Labwork: None ordered  Testing/Procedures: None ordered  Follow-Up: Your physician wants you to follow-up in: 12 months with Dr Knox Saliva will receive a reminder letter in the mail two months in advance. If you don't receive a letter, please call our office to schedule the follow-up appointment.   Any Other Special Instructions Will Be Listed Below (If Applicable).

## 2014-09-26 NOTE — Progress Notes (Signed)
HPI William Stein returns today for followup. He is a very pleasant 81 year old man with a history of atrial fibrillation, sinus bradycardia, and hypertension. He has insomnia. He works the night shift. The patient has had no symptomatic atrial fibrillation. He was switched from flecainide to amiodarone and was decreased from 400 mg daily down to 200 mg daily 6 months ago. No symptomatic bradycardia recently. He has had no symptoms of atrial fib. He remains active.   No Known Allergies   Current Outpatient Prescriptions  Medication Sig Dispense Refill  . amiodarone (PACERONE) 200 MG tablet Take 1 tablet (200 mg total) by mouth daily.    Marland Kitchen amLODipine (NORVASC) 2.5 MG tablet Take 1 tablet (2.5 mg total) by mouth daily. 30 tablet 6  . Cholecalciferol (VITAMIN D-3) 1000 UNITS CAPS Take 1,000 Units by mouth daily.     . dabigatran (PRADAXA) 150 MG CAPS Take 150 mg by mouth 2 (two) times daily.      Marland Kitchen esomeprazole (NEXIUM) 40 MG capsule Take 40 mg by mouth daily at 12 noon.    Marland Kitchen HYDROcodone-acetaminophen (NORCO) 5-325 MG per tablet Take 1 tablet by mouth every 6 (six) hours as needed for severe pain. 20 tablet 0  . Menthol, Topical Analgesic, (BIOFREEZE EX) Apply 1 application topically 3 (three) times daily as needed (for pain).    . Multiple Vitamin (MULTIVITAMIN) tablet Take 1 tablet by mouth daily.      . sodium chloride (OCEAN) 0.65 % nasal spray Place 2 sprays into the nose 2 (two) times daily.    . vitamin C (ASCORBIC ACID) 500 MG tablet Take 500 mg by mouth daily.    . vitamin E 400 UNIT capsule Take 400 Units by mouth daily.    . [DISCONTINUED] diltiazem (DILACOR XR) 180 MG 24 hr capsule Take 1 capsule (180 mg total) by mouth daily. 30 capsule 6   No current facility-administered medications for this visit.     Past Medical History  Diagnosis Date  . Bradycardia     a. during 2009, 2/2 combination of diltiazem and BB  . PAF (paroxysmal atrial fibrillation) 2007    a. Previously on  flecainide. b. 12/2013: s/p DCCV, started on amio. AC w/ pradaxa.  Marland Kitchen HTN (hypertension)   . HLD (hyperlipidemia)   . GERD (gastroesophageal reflux disease)   . Noncompliance     a. Prior issue - pt more recently endorses compliance with meds.  . CKD (chronic kidney disease), stage II     ROS:   All systems reviewed and negative except as noted in the HPI.   Past Surgical History  Procedure Laterality Date  . Doppler echocardiography  2011  . Exercise stress test  2012  . Colonoscopy  2003  . Cardiac catheterization  2009  . Cardioversion N/A 12/18/2013    Procedure: CARDIOVERSION;  Surgeon: Lelon Perla, MD;  Location: [facility] ENDOSCOPY;  Service: Cardiovascular;  Laterality: N/A;     Family History  Problem Relation Age of Onset  . Dementia Father 52  . Other Mother 91    old age  . Coronary artery disease Neg Hx      History   Social History  . Marital Status: Widowed    Spouse Name: N/A  . Number of Children: N/A  . Years of Education: N/A   Occupational History  . Full time [employer]   Social History Main Topics  . Smoking status: Never Smoker   . Smokeless tobacco: Not on  file  . Alcohol Use: No  . Drug Use: No  . Sexual Activity: Not on file   Other Topics Concern  . Not on file   Social History Narrative   Widowed      There were no vitals taken for this visit.  Physical Exam:  Well appearing 81 year old man, NAD HEENT: Unremarkable Neck:  No JVD, no thyromegally Lungs:  Clear with no wheezes, rales, or rhonchi. HEART:  Regular rate rhythm, no murmurs, no rubs, no clicks Abd:  soft, positive bowel sounds, no organomegally, no rebound, no guarding Ext:  2 plus pulses, no edema, no cyanosis, no clubbing Skin:  No rashes no nodules Neuro:  CN II through XII intact, motor grossly intact  EKG Normal sinus rhythm.  Assess/Plan:

## 2014-09-26 NOTE — Assessment & Plan Note (Signed)
He has very mild sinus node dysfunction. He will continue his current meds as he is not symptomatic.

## 2014-09-26 NOTE — Assessment & Plan Note (Signed)
This is much improved. Will follow.

## 2014-09-27 DIAGNOSIS — D045 Carcinoma in situ of skin of trunk: Secondary | ICD-10-CM | POA: Diagnosis not present

## 2014-09-27 DIAGNOSIS — Z85828 Personal history of other malignant neoplasm of skin: Secondary | ICD-10-CM | POA: Diagnosis not present

## 2014-10-15 DIAGNOSIS — H2512 Age-related nuclear cataract, left eye: Secondary | ICD-10-CM | POA: Diagnosis not present

## 2014-10-15 DIAGNOSIS — H25012 Cortical age-related cataract, left eye: Secondary | ICD-10-CM | POA: Diagnosis not present

## 2014-10-15 DIAGNOSIS — H25812 Combined forms of age-related cataract, left eye: Secondary | ICD-10-CM | POA: Diagnosis not present

## 2014-12-10 DIAGNOSIS — I480 Paroxysmal atrial fibrillation: Secondary | ICD-10-CM | POA: Diagnosis not present

## 2014-12-27 DIAGNOSIS — M5136 Other intervertebral disc degeneration, lumbar region: Secondary | ICD-10-CM | POA: Diagnosis not present

## 2014-12-27 DIAGNOSIS — I10 Essential (primary) hypertension: Secondary | ICD-10-CM | POA: Diagnosis not present

## 2014-12-27 DIAGNOSIS — I480 Paroxysmal atrial fibrillation: Secondary | ICD-10-CM | POA: Diagnosis not present

## 2014-12-27 DIAGNOSIS — Z23 Encounter for immunization: Secondary | ICD-10-CM | POA: Diagnosis not present

## 2014-12-27 DIAGNOSIS — S90221A Contusion of right lesser toe(s) with damage to nail, initial encounter: Secondary | ICD-10-CM | POA: Diagnosis not present

## 2015-02-03 IMAGING — CR DG CHEST 2V
2 series · 2 of 2 positions shown · non-contrast
Comparison: 09/08/2013 ; 07/09/2007

CLINICAL DATA: Atrial fibrillation. Shortness of breath. Back pain.

EXAM:
CHEST  2 VIEW

[w chest pa]
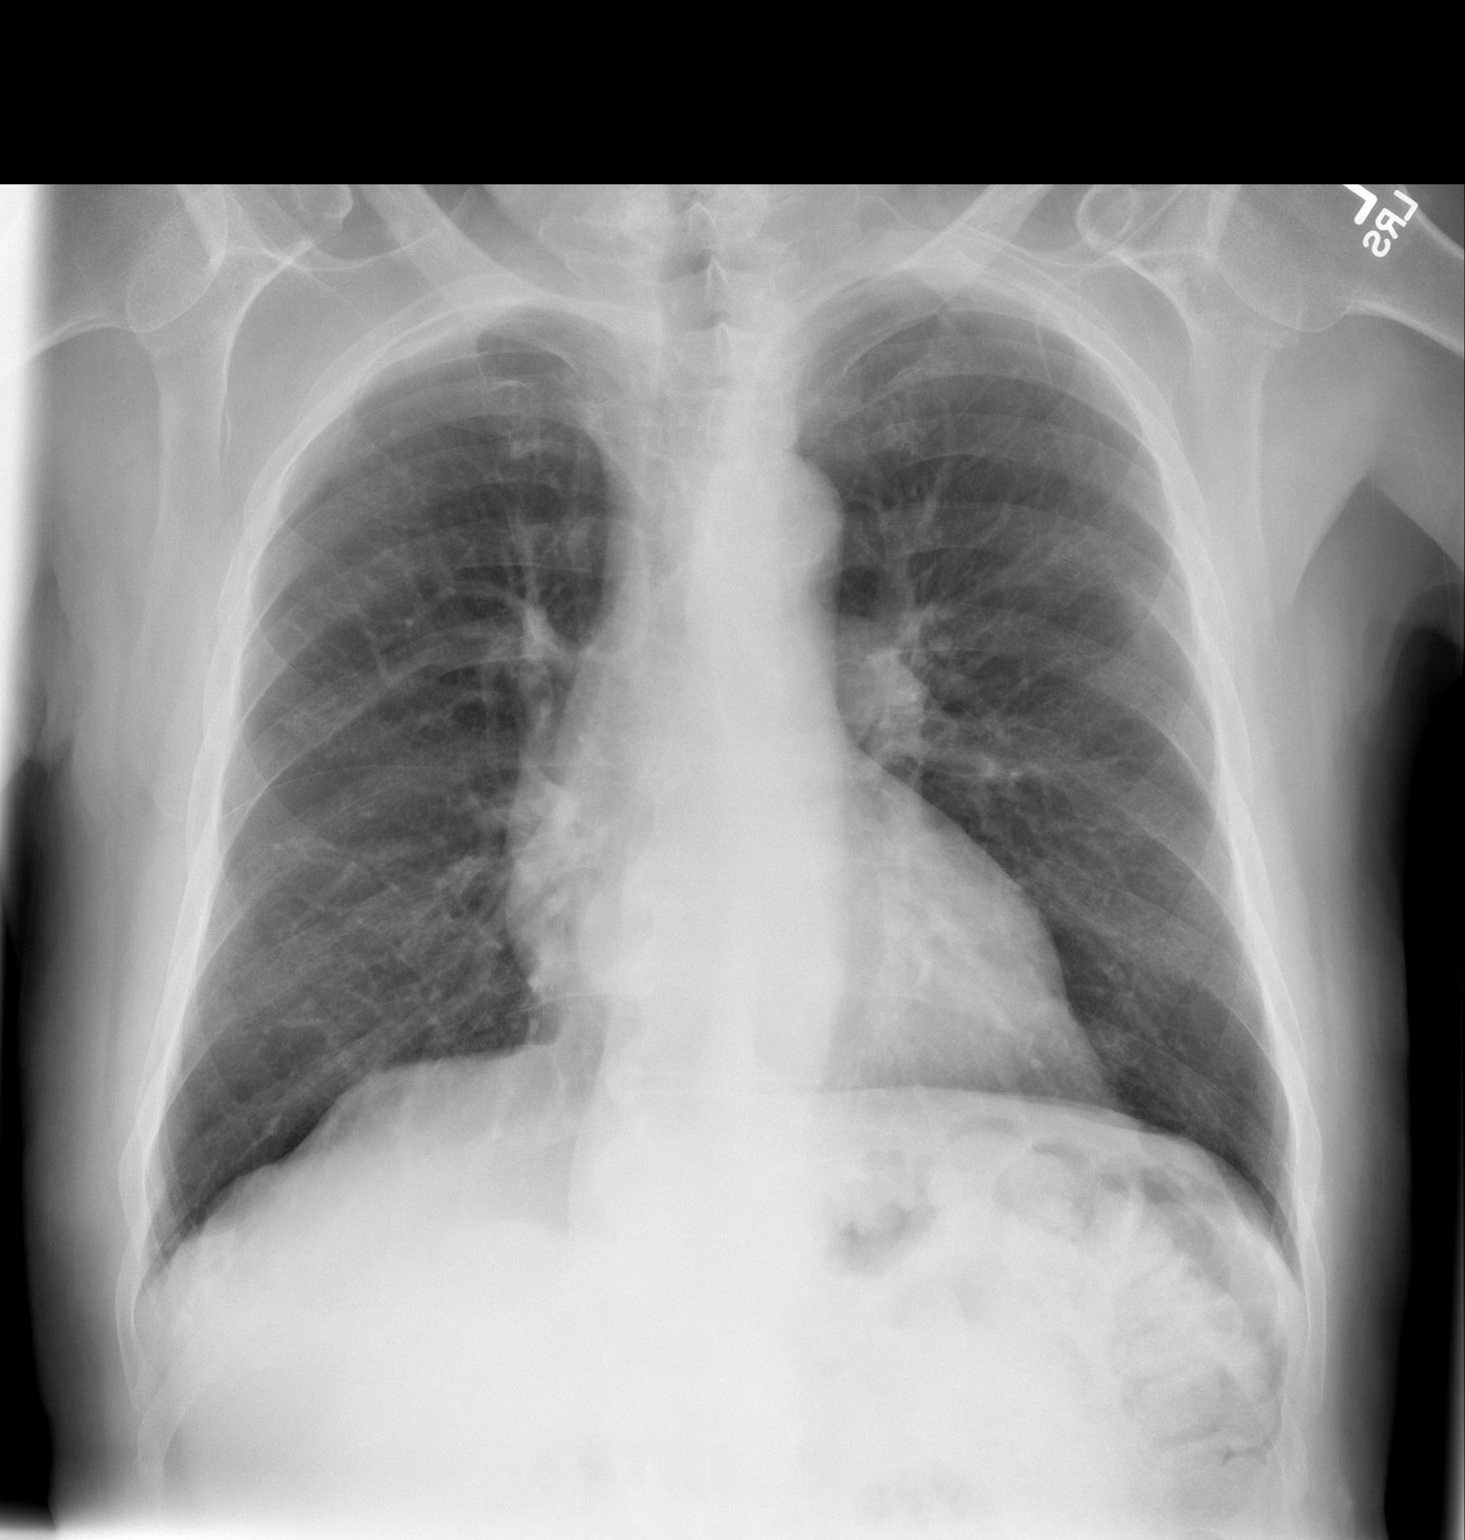

[w chest lat]
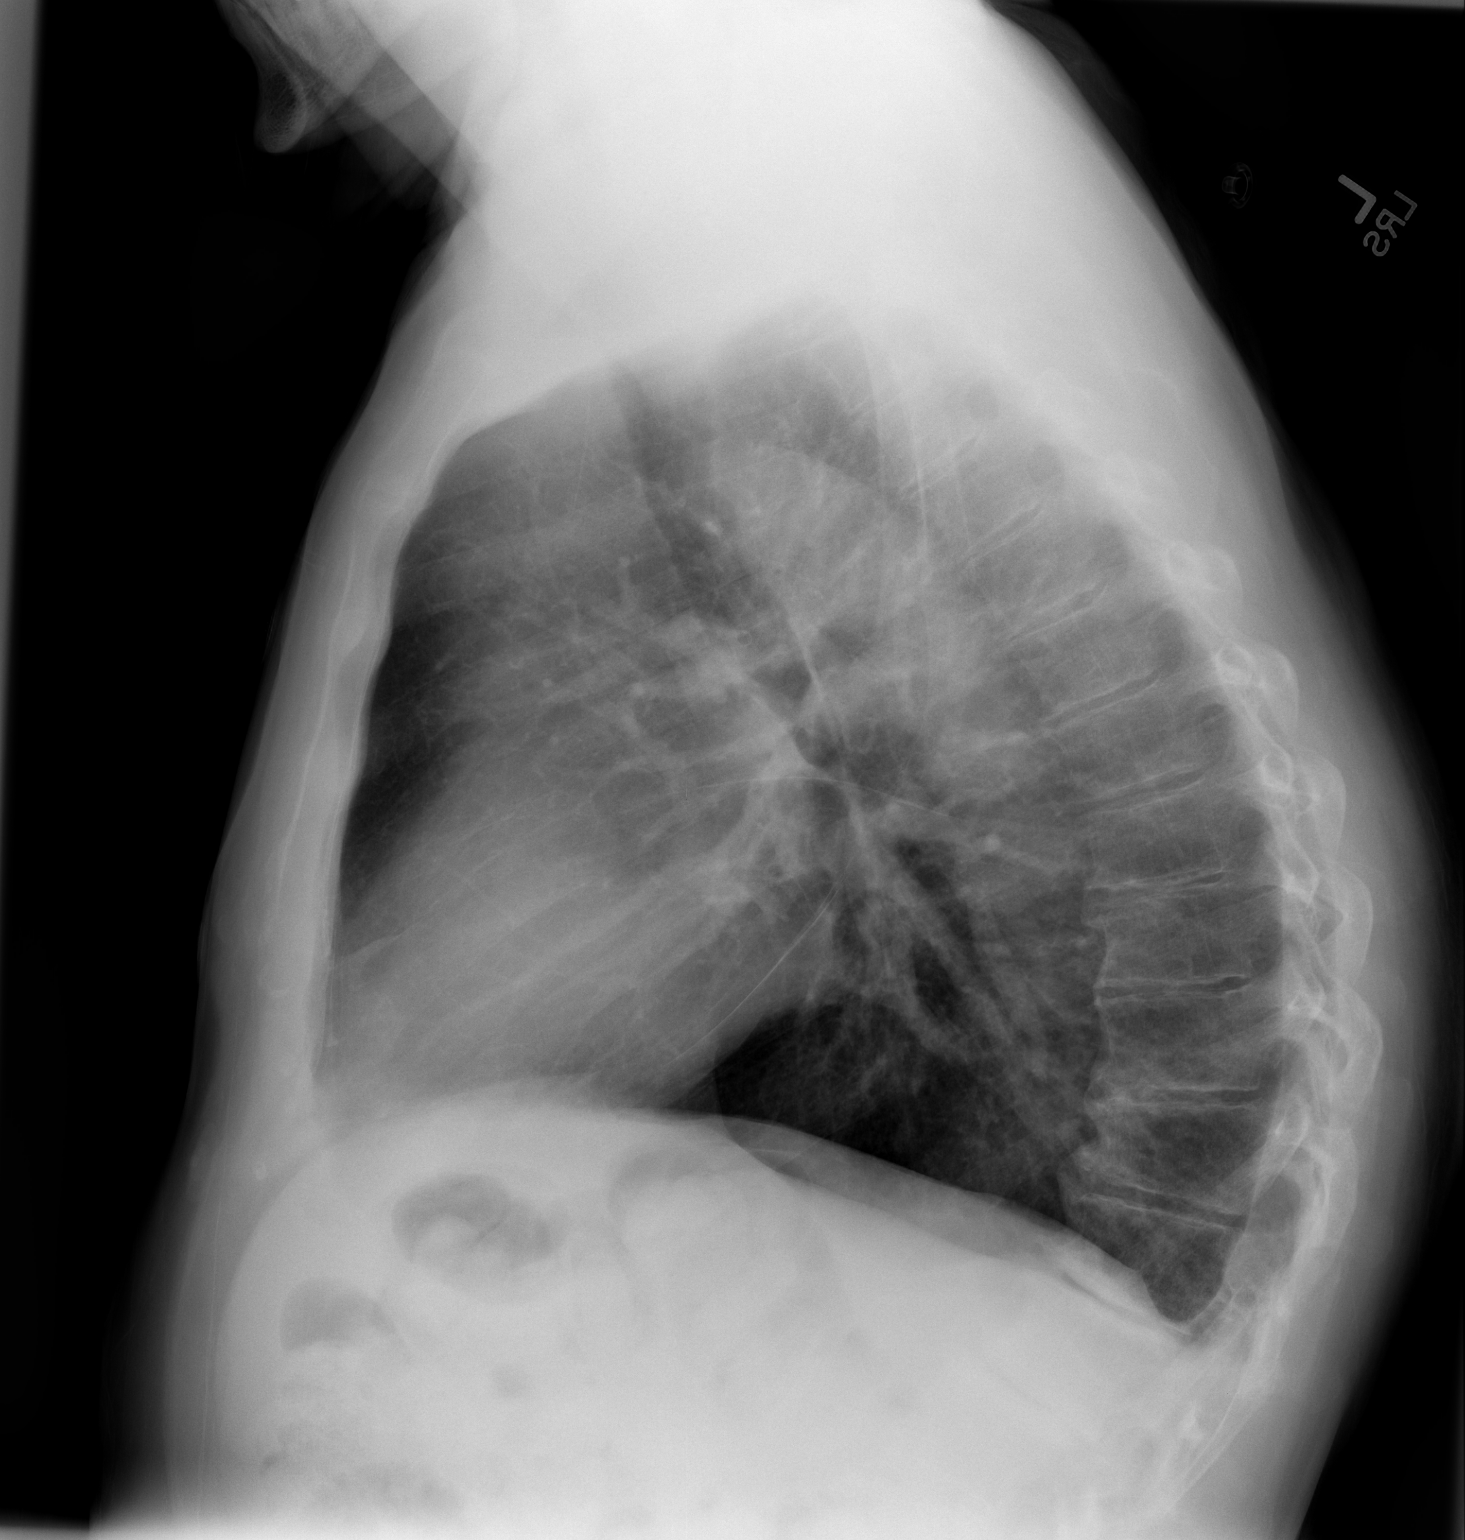

[2 of 2 positions shown; findings below may reference images not displayed]

FINDINGS: The patient is rotated to the Right on today's radiograph, reducing
diagnostic sensitivity and specificity. Heart size within normal
limits. Thoracic spondylosis.

The lungs appear clear.
IMPRESSION: 1. No acute findings.
2. Thoracic spondylosis.

## 2015-02-25 DIAGNOSIS — I10 Essential (primary) hypertension: Secondary | ICD-10-CM | POA: Diagnosis not present

## 2015-02-25 DIAGNOSIS — F321 Major depressive disorder, single episode, moderate: Secondary | ICD-10-CM | POA: Diagnosis not present

## 2015-02-25 DIAGNOSIS — I480 Paroxysmal atrial fibrillation: Secondary | ICD-10-CM | POA: Diagnosis not present

## 2015-04-04 ENCOUNTER — Other Ambulatory Visit: Payer: Self-pay | Admitting: Internal Medicine

## 2015-04-30 DIAGNOSIS — R7309 Other abnormal glucose: Secondary | ICD-10-CM | POA: Diagnosis not present

## 2015-04-30 DIAGNOSIS — I10 Essential (primary) hypertension: Secondary | ICD-10-CM | POA: Diagnosis not present

## 2015-04-30 DIAGNOSIS — K219 Gastro-esophageal reflux disease without esophagitis: Secondary | ICD-10-CM | POA: Diagnosis not present

## 2015-04-30 DIAGNOSIS — I480 Paroxysmal atrial fibrillation: Secondary | ICD-10-CM | POA: Diagnosis not present

## 2015-05-23 DIAGNOSIS — D1721 Benign lipomatous neoplasm of skin and subcutaneous tissue of right arm: Secondary | ICD-10-CM | POA: Diagnosis not present

## 2015-05-23 DIAGNOSIS — D1722 Benign lipomatous neoplasm of skin and subcutaneous tissue of left arm: Secondary | ICD-10-CM | POA: Diagnosis not present

## 2015-05-23 DIAGNOSIS — D1801 Hemangioma of skin and subcutaneous tissue: Secondary | ICD-10-CM | POA: Diagnosis not present

## 2015-05-23 DIAGNOSIS — L570 Actinic keratosis: Secondary | ICD-10-CM | POA: Diagnosis not present

## 2015-05-23 DIAGNOSIS — L821 Other seborrheic keratosis: Secondary | ICD-10-CM | POA: Diagnosis not present

## 2015-05-23 DIAGNOSIS — Z85828 Personal history of other malignant neoplasm of skin: Secondary | ICD-10-CM | POA: Diagnosis not present

## 2015-10-08 DIAGNOSIS — R7309 Other abnormal glucose: Secondary | ICD-10-CM | POA: Diagnosis not present

## 2015-10-08 DIAGNOSIS — I480 Paroxysmal atrial fibrillation: Secondary | ICD-10-CM | POA: Diagnosis not present

## 2015-10-08 DIAGNOSIS — I10 Essential (primary) hypertension: Secondary | ICD-10-CM | POA: Diagnosis not present

## 2015-10-11 IMAGING — CR DG RIBS W/ CHEST 3+V*L*
5 series · 5 of 5 positions shown · non-contrast
Comparison: December 17, 2013.

CLINICAL DATA: Acute left-sided rib pain after falling from bed 3
days ago. Initial encounter.

EXAM:
LEFT RIBS AND CHEST - 3+ VIEW

[w chest pa]
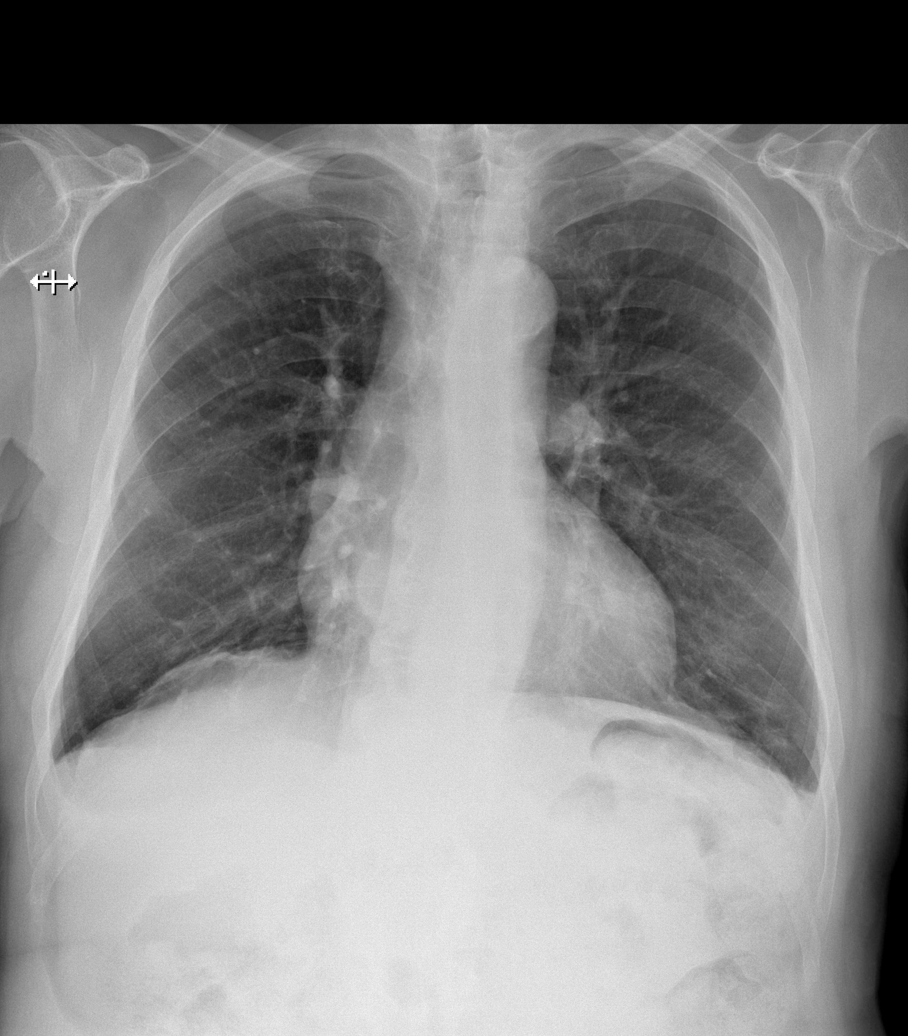

[w ribs ap upper left]
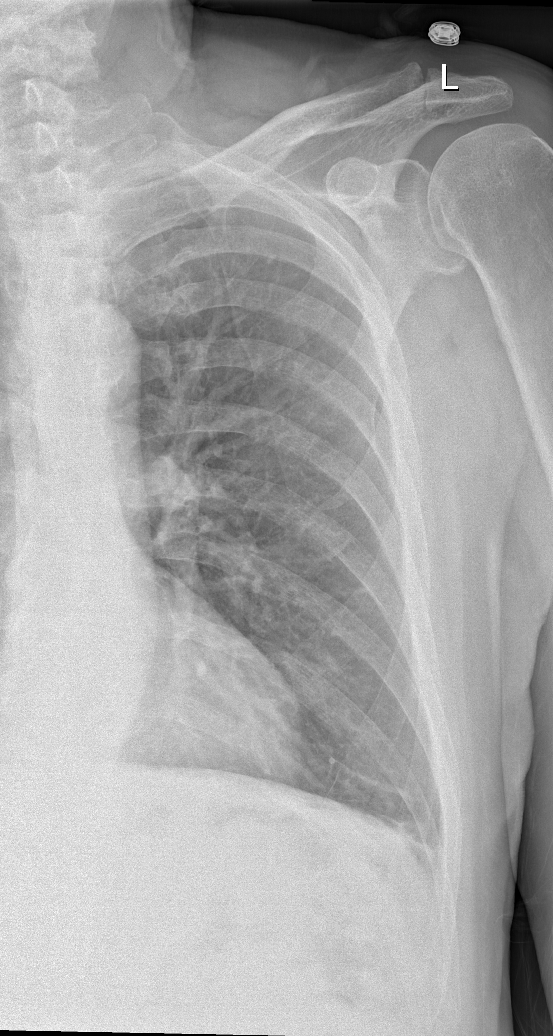

[w ribs ap lower left]
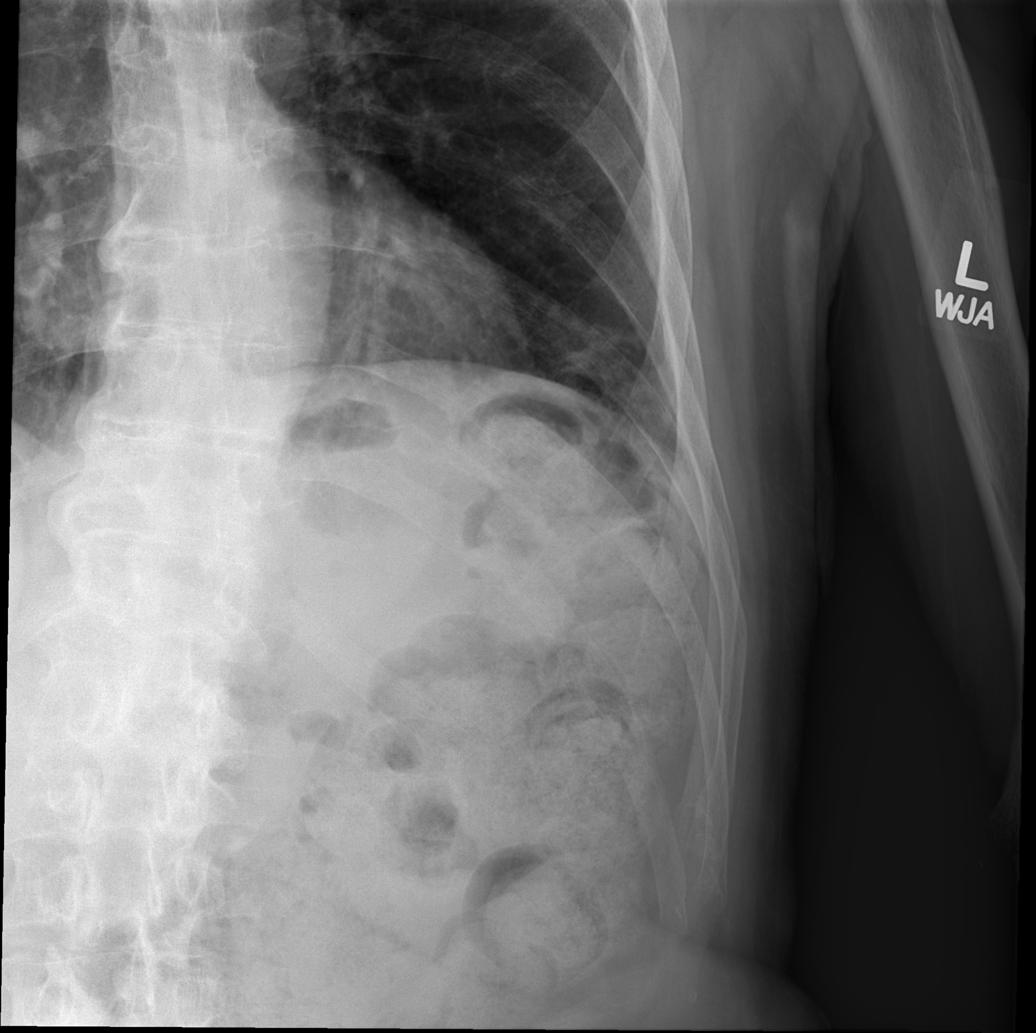

[w ribs obl left (1 of 2)]
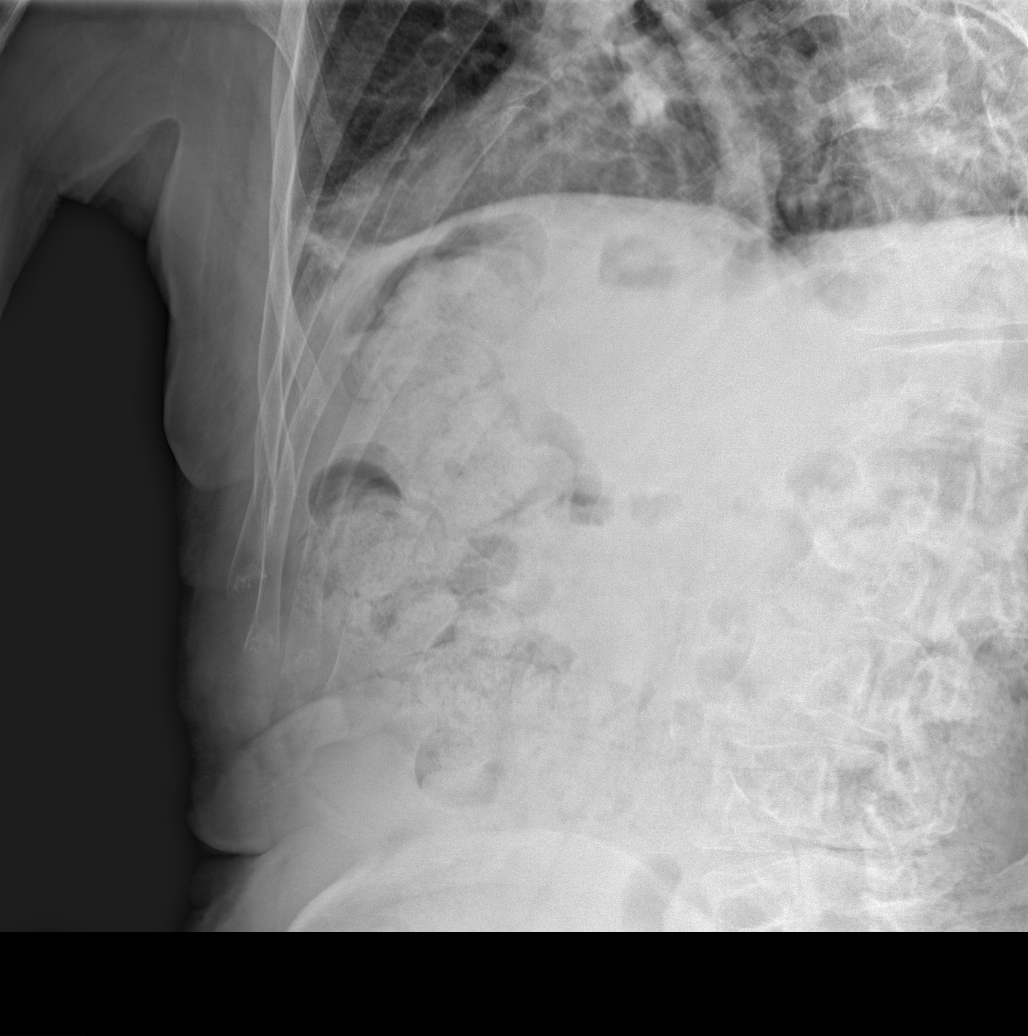

[w ribs obl left (2 of 2)]
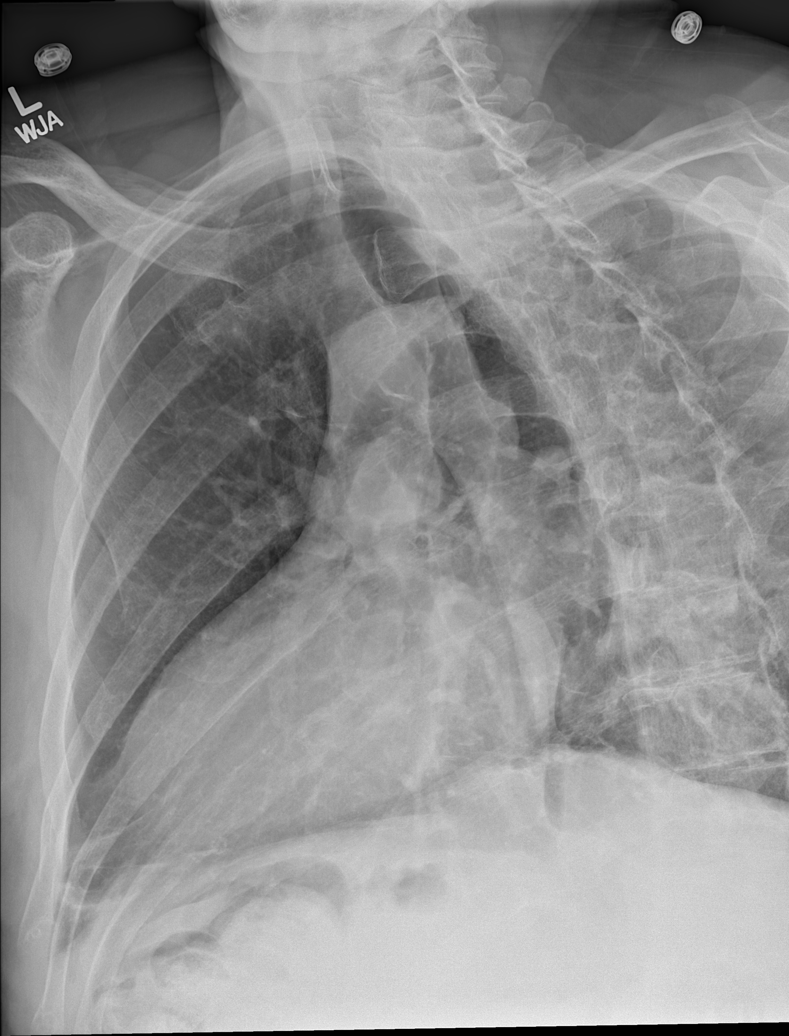

[5 of 5 positions shown; findings below may reference images not displayed]

FINDINGS: Deformity of anterior portion of left seventh rib is noted
consistent with fracture of indeterminate age. There is no evidence
of pneumothorax or pleural effusion. Both lungs are clear. Heart
size and mediastinal contours are within normal limits.
IMPRESSION: Fracture of anterior portion of left seventh rib of indeterminate
age.

## 2015-10-15 DIAGNOSIS — R7309 Other abnormal glucose: Secondary | ICD-10-CM | POA: Diagnosis not present

## 2015-10-15 DIAGNOSIS — M5136 Other intervertebral disc degeneration, lumbar region: Secondary | ICD-10-CM | POA: Diagnosis not present

## 2015-10-15 DIAGNOSIS — I480 Paroxysmal atrial fibrillation: Secondary | ICD-10-CM | POA: Diagnosis not present

## 2015-10-15 DIAGNOSIS — K219 Gastro-esophageal reflux disease without esophagitis: Secondary | ICD-10-CM | POA: Diagnosis not present

## 2016-09-08 ENCOUNTER — Emergency Department (HOSPITAL_COMMUNITY): Payer: Medicare Other

## 2016-09-08 ENCOUNTER — Emergency Department (HOSPITAL_COMMUNITY)
Admission: EM | Admit: 2016-09-08 | Discharge: 2016-09-08 | Disposition: A | Discharge status: Home / Self Care | Payer: Medicare Other | Attending: Emergency Medicine | Admitting: Emergency Medicine

## 2016-09-08 ENCOUNTER — Encounter (HOSPITAL_COMMUNITY): Payer: Self-pay | Admitting: Emergency Medicine

## 2016-09-08 DIAGNOSIS — Y939 Activity, unspecified: Secondary | ICD-10-CM | POA: Insufficient documentation

## 2016-09-08 DIAGNOSIS — I129 Hypertensive chronic kidney disease with stage 1 through stage 4 chronic kidney disease, or unspecified chronic kidney disease: Secondary | ICD-10-CM | POA: Diagnosis not present

## 2016-09-08 DIAGNOSIS — S0083XA Contusion of other part of head, initial encounter: Secondary | ICD-10-CM | POA: Diagnosis not present

## 2016-09-08 DIAGNOSIS — Y999 Unspecified external cause status: Secondary | ICD-10-CM | POA: Diagnosis not present

## 2016-09-08 DIAGNOSIS — W01198A Fall on same level from slipping, tripping and stumbling with subsequent striking against other object, initial encounter: Secondary | ICD-10-CM | POA: Insufficient documentation

## 2016-09-08 DIAGNOSIS — Z79899 Other long term (current) drug therapy: Secondary | ICD-10-CM | POA: Diagnosis not present

## 2016-09-08 DIAGNOSIS — N182 Chronic kidney disease, stage 2 (mild): Secondary | ICD-10-CM | POA: Diagnosis not present

## 2016-09-08 DIAGNOSIS — Y929 Unspecified place or not applicable: Secondary | ICD-10-CM | POA: Insufficient documentation

## 2016-09-08 DIAGNOSIS — S0990XA Unspecified injury of head, initial encounter: Secondary | ICD-10-CM

## 2016-09-08 DIAGNOSIS — W19XXXA Unspecified fall, initial encounter: Secondary | ICD-10-CM

## 2016-09-08 NOTE — ED Triage Notes (Signed)
Pt was trying to get a gas tank unhooked from his grill and flipped the grill over and caused him to fall and hit his head on the concrete. Pt denies any LOC. Pt is on blood thinner.

## 2016-09-08 NOTE — ED Provider Notes (Addendum)
[facility] DEPT Provider Note   CSN: [id number] Arrival date & time: 09/08/16  1649  By signing my name below, I, William Stein, attest that this documentation has been prepared under the direction and in the presence of William Muskrat, MD. Electronically Signed: Marcello Stein, ED Scribe. 09/08/16. 6:53 PM.   History   Chief Complaint Chief Complaint  Patient presents with  . Fall     The history is provided by the patient. No language interpreter was used.    HPI Comments: William Stein is a 83 y.o. male , with a PMHx of arthritis, who presents to the Emergency Department complaining of a gradually worsening painful abrasion to the right forehead s/p a mechanical fall that occurred earlier this evening. Pt flipped a grill over, somehow lost his balance and fell forward, striking his forehead and landing on outstretched hands onto the concrete. He denies LOC. [facility], but was told to come to ED to receive a CT scan. He describes his pain as throbbing. Pt is on Eliquis. Pt denies knee/hip/ankle pain, visual disturbance, confusion, chest pain, abdominal pain, neck pain, additional injuries at this time.  Past Medical History:  Diagnosis Date  . Bradycardia    a. during 2009, 2/2 combination of diltiazem and BB  . CKD (chronic kidney disease), stage II   . GERD (gastroesophageal reflux disease)   . HLD (hyperlipidemia)   . HTN (hypertension)   . Noncompliance    a. Prior issue - pt more recently endorses compliance with meds.  Marland Kitchen PAF (paroxysmal atrial fibrillation) ([place]) 2007   a. Previously on flecainide. b. 12/2013: s/p DCCV, started on amio. AC w/ pradaxa.    Patient Active Problem List   Diagnosis Date Noted  . Hyperglycemia 01/09/2014  . CKD (chronic kidney disease), stage II 01/09/2014  . PAF (paroxysmal atrial fibrillation) ([place]) 12/17/2013  . Arthritis 10/23/2013  . Noncompliance 11/07/2011  . Essential hypertension, benign 06/06/2009  .  BRADYCARDIA 09/23/2008    Past Surgical History:  Procedure Laterality Date  . CARDIAC CATHETERIZATION  2009  . CARDIOVERSION N/A 12/18/2013   Procedure: CARDIOVERSION;  Surgeon: Lelon Perla, MD;  Location: [facility] ENDOSCOPY;  Service: Cardiovascular;  Laterality: N/A;  . COLONOSCOPY  2003  . DOPPLER ECHOCARDIOGRAPHY  2011  . exercise stress test  2012       Home Medications    Prior to Admission medications   Medication Sig Start Date End Date Taking? Authorizing Provider  amiodarone (PACERONE) 200 MG tablet Take 200 mg by mouth daily. Take 1 tablet by mouth Mon-Fri and 1/2 tablet by mouth Sat and Sun 03/27/14   Evans Lance, MD  amiodarone (PACERONE) 200 MG tablet TAKE TWO TABLETS BY MOUTH ONCE DAILY 04/04/15   Evans Lance, MD  Cholecalciferol (VITAMIN D-3) 1000 UNITS CAPS Take 1,000 Units by mouth daily.     [provider]  dabigatran (PRADAXA) 150 MG CAPS Take 150 mg by mouth 2 (two) times daily.      [provider]  Multiple Vitamin (MULTIVITAMIN) tablet Take 1 tablet by mouth daily.      [provider]  sodium chloride (OCEAN) 0.65 % nasal spray Place 2 sprays into the nose 2 (two) times daily.    [provider]  vitamin C (ASCORBIC ACID) 500 MG tablet Take 500 mg by mouth daily.    [provider]  vitamin E 400 UNIT capsule Take 400 Units by mouth daily.    [provider]    Family History Family History  Problem Relation Age of Onset  . Other Mother 15    old age  . Dementia Father 46  . Coronary artery disease Neg Hx     Social History Social History  Substance Use Topics  . Smoking status: Never Smoker  . Smokeless tobacco: Not on file  . Alcohol use No     Allergies   Patient has no known allergies.   Review of Systems Review of Systems  Constitutional:       Per HPI, otherwise negative  HENT:       Per HPI, otherwise negative  Eyes: Negative for visual disturbance.  Respiratory:        Per HPI, otherwise negative  Cardiovascular: Negative for chest pain.       Per HPI, otherwise negative  Gastrointestinal: Negative for abdominal pain and vomiting.  Endocrine:       Negative aside from HPI  Genitourinary:       Neg aside from HPI   Musculoskeletal: Negative for arthralgias and neck pain.       Per HPI, otherwise negative  Skin: Positive for wound.  Allergic/Immunologic: Negative for immunocompromised state.  Neurological: Positive for headaches. Negative for syncope.  Hematological: Bruises/bleeds easily.  Psychiatric/Behavioral: Negative for confusion.     Physical Exam Updated Vital Signs BP 139/69 (BP Location: Left Arm)   Pulse 65   Temp 98.6 F (37 C) (Oral)   Resp 16   SpO2 96%   Physical Exam  Constitutional: He is oriented to person, place, and time. He appears well-developed. No distress.  HENT:  Head: Normocephalic.    Eyes: Conjunctivae and EOM are normal.  Cardiovascular: Normal rate and regular rhythm.   Pulmonary/Chest: Effort normal. No stridor. No respiratory distress.  Abdominal: He exhibits no distension.  Musculoskeletal: He exhibits no edema.  Each hand has 1 superficial abrasion, but no deformity, no loss of range of motion, no tenderness to palpation  Neurological: He is alert and oriented to person, place, and time. He displays no atrophy and no tremor. He exhibits normal muscle tone. He displays no seizure activity. Coordination normal.  Skin: Skin is warm and dry.  Superficial abrasion each hand, no surrounding tenderness, swelling, deformity  Psychiatric: He has a normal mood and affect.  Nursing note and vitals reviewed.    ED Treatments / Results   DIAGNOSTIC STUDIES: Oxygen Saturation is 96% on RA, adequate by my interpretation.   COORDINATION OF CARE: 6:50 PM-Discussed next steps with pt. Pt verbalized understanding and is agreeable with the plan.    Radiology Ct Head Wo Contrast  Result Date:  09/08/2016 CLINICAL DATA:  Status post fall, with right periorbital hematoma. Patient on blood thinners. Concern for head or maxillofacial injury. Initial encounter. EXAM: CT HEAD WITHOUT CONTRAST CT MAXILLOFACIAL WITHOUT CONTRAST TECHNIQUE: Multidetector CT imaging of the head and maxillofacial structures were performed using the standard protocol without intravenous contrast. Multiplanar CT image reconstructions of the maxillofacial structures were also generated. COMPARISON:  CT of the head performed 12/02/2008 FINDINGS: CT HEAD FINDINGS Brain: No evidence of acute infarction, hemorrhage, hydrocephalus, extra-axial collection or mass lesion/mass effect. Prominence of the ventricles and sulci reflects mild to moderate cortical volume loss. Mild cerebellar atrophy is noted. Scattered periventricular and subcortical white matter change likely reflects small vessel ischemic microangiopathy. Chronic ischemic change is noted along the brainstem. The fourth ventricle is within normal limits. The basal ganglia are unremarkable in appearance. The  cerebral hemispheres demonstrate grossly normal gray-white differentiation. No mass effect or midline shift is seen. Vascular: No hyperdense vessel or unexpected calcification. Skull: There is no evidence of fracture; visualized osseous structures are unremarkable in appearance. Other: Soft tissue swelling is noted overlying the right frontal calvarium and about the right orbit. CT MAXILLOFACIAL FINDINGS Osseous: There is no evidence of fracture or dislocation. The maxilla and mandible appear intact. The nasal bone is unremarkable in appearance. The visualized dentition demonstrates no acute abnormality. Orbits: The orbits are intact bilaterally. Sinuses: The visualized paranasal sinuses and mastoid air cells are well-aerated. Soft tissues: No significant soft tissue abnormalities are seen. The parapharyngeal fat planes are preserved. The nasopharynx, oropharynx and hypopharynx  are unremarkable in appearance. The visualized portions of the valleculae and piriform sinuses are grossly unremarkable. The parotid and submandibular glands are within normal limits. No cervical lymphadenopathy is seen. IMPRESSION: 1. No evidence of traumatic intracranial injury or fracture. 2. No evidence of fracture or dislocation with regard to the maxillofacial structures. 3. Soft tissue swelling overlying the right frontal calvarium and about the right orbit. 4. Mild to moderate cortical volume loss and scattered small vessel ischemic microangiopathy. 5. Chronic ischemic change along the brainstem. Electronically Signed   By: Garald Balding M.D.   On: 09/08/2016 19:20   Ct Maxillofacial Wo Cm  Result Date: 09/08/2016 CLINICAL DATA:  Status post fall, with right periorbital hematoma. Patient on blood thinners. Concern for head or maxillofacial injury. Initial encounter. EXAM: CT HEAD WITHOUT CONTRAST CT MAXILLOFACIAL WITHOUT CONTRAST TECHNIQUE: Multidetector CT imaging of the head and maxillofacial structures were performed using the standard protocol without intravenous contrast. Multiplanar CT image reconstructions of the maxillofacial structures were also generated. COMPARISON:  CT of the head performed 12/02/2008 FINDINGS: CT HEAD FINDINGS Brain: No evidence of acute infarction, hemorrhage, hydrocephalus, extra-axial collection or mass lesion/mass effect. Prominence of the ventricles and sulci reflects mild to moderate cortical volume loss. Mild cerebellar atrophy is noted. Scattered periventricular and subcortical white matter change likely reflects small vessel ischemic microangiopathy. Chronic ischemic change is noted along the brainstem. The fourth ventricle is within normal limits. The basal ganglia are unremarkable in appearance. The cerebral hemispheres demonstrate grossly normal gray-white differentiation. No mass effect or midline shift is seen. Vascular: No hyperdense vessel or unexpected  calcification. Skull: There is no evidence of fracture; visualized osseous structures are unremarkable in appearance. Other: Soft tissue swelling is noted overlying the right frontal calvarium and about the right orbit. CT MAXILLOFACIAL FINDINGS Osseous: There is no evidence of fracture or dislocation. The maxilla and mandible appear intact. The nasal bone is unremarkable in appearance. The visualized dentition demonstrates no acute abnormality. Orbits: The orbits are intact bilaterally. Sinuses: The visualized paranasal sinuses and mastoid air cells are well-aerated. Soft tissues: No significant soft tissue abnormalities are seen. The parapharyngeal fat planes are preserved. The nasopharynx, oropharynx and hypopharynx are unremarkable in appearance. The visualized portions of the valleculae and piriform sinuses are grossly unremarkable. The parotid and submandibular glands are within normal limits. No cervical lymphadenopathy is seen. IMPRESSION: 1. No evidence of traumatic intracranial injury or fracture. 2. No evidence of fracture or dislocation with regard to the maxillofacial structures. 3. Soft tissue swelling overlying the right frontal calvarium and about the right orbit. 4. Mild to moderate cortical volume loss and scattered small vessel ischemic microangiopathy. 5. Chronic ischemic change along the brainstem. Electronically Signed   By: Garald Balding M.D.   On: 09/08/2016 19:20  Procedures Procedures (including critical care time)    Initial Impression / Assessment and Plan / ED Course  I have reviewed the triage vital signs and the nursing notes.  Pertinent labs & imaging results that were available during my care of the patient were reviewed by me and considered in my medical decision making (see chart for details).  7:41 PM On repeat exam the patient is in no distress. I discussed all findings from radiographic studies, physical exam with him. Patient will have wound care, pressure  dressing of his head lesion. With no evidence for intra-cranial hemorrhage, no fracture, neurologic deficits, the patient will be discharged in stable condition. He expresses appreciation of return precautions, follow-up instructions.   Final Clinical Impressions(s) / ED Diagnoses   Final diagnoses:  Fall, initial encounter  Injury of head, initial encounter    I personally performed the services described in this documentation, which was scribed in my presence. The recorded information has been reviewed and is accurate.    William Muskrat, MD 09/08/16 Leeanne Mannan    William Muskrat, MD 09/08/16 2008

## 2016-09-08 NOTE — Discharge Instructions (Signed)
As discussed, your evaluation today has been largely reassuring.  But, it is important that you monitor your condition carefully, and do not hesitate to return to the ED if you develop new, or concerning changes in your condition. ? ?Otherwise, please follow-up with your physician for appropriate ongoing care. ? ?

## 2016-11-23 ENCOUNTER — Other Ambulatory Visit: Payer: Self-pay

## 2017-01-23 ENCOUNTER — Other Ambulatory Visit: Payer: Self-pay

## 2017-01-23 ENCOUNTER — Emergency Department (HOSPITAL_COMMUNITY): Payer: Medicare Other

## 2017-01-23 ENCOUNTER — Emergency Department (HOSPITAL_COMMUNITY)
Admission: EM | Admit: 2017-01-23 | Discharge: 2017-01-23 | Disposition: A | Discharge status: Home / Self Care | Payer: Medicare Other | Attending: Emergency Medicine | Admitting: Emergency Medicine

## 2017-01-23 ENCOUNTER — Encounter (HOSPITAL_COMMUNITY): Payer: Self-pay | Admitting: *Deleted

## 2017-01-23 DIAGNOSIS — R000 Tachycardia, unspecified: Secondary | ICD-10-CM | POA: Diagnosis not present

## 2017-01-23 DIAGNOSIS — Z7901 Long term (current) use of anticoagulants: Secondary | ICD-10-CM | POA: Insufficient documentation

## 2017-01-23 DIAGNOSIS — I129 Hypertensive chronic kidney disease with stage 1 through stage 4 chronic kidney disease, or unspecified chronic kidney disease: Secondary | ICD-10-CM | POA: Diagnosis not present

## 2017-01-23 DIAGNOSIS — N182 Chronic kidney disease, stage 2 (mild): Secondary | ICD-10-CM | POA: Diagnosis not present

## 2017-01-23 DIAGNOSIS — R002 Palpitations: Secondary | ICD-10-CM | POA: Diagnosis present

## 2017-01-23 DIAGNOSIS — I4891 Unspecified atrial fibrillation: Secondary | ICD-10-CM | POA: Diagnosis not present

## 2017-01-23 DIAGNOSIS — Z79899 Other long term (current) drug therapy: Secondary | ICD-10-CM | POA: Insufficient documentation

## 2017-01-23 DIAGNOSIS — M47814 Spondylosis without myelopathy or radiculopathy, thoracic region: Secondary | ICD-10-CM | POA: Diagnosis not present

## 2017-01-23 LAB — BASIC METABOLIC PANEL
Anion gap: 7 (ref 5–15)
BUN: 15 mg/dL (ref 6–20)
CALCIUM: 8.9 mg/dL (ref 8.9–10.3)
CO2: 24 mmol/L (ref 22–32)
Chloride: 104 mmol/L (ref 101–111)
Creatinine, Ser: 1.2 mg/dL (ref 0.61–1.24)
GFR calc non Af Amer: 54 mL/min — ABNORMAL LOW (ref >60)
Glucose, Bld: 130 mg/dL — ABNORMAL HIGH (ref 65–99)
POTASSIUM: 4.5 mmol/L (ref 3.5–5.1)
SODIUM: 135 mmol/L (ref 135–145)

## 2017-01-23 LAB — CBC
HEMATOCRIT: 37.8 % — AB (ref 39.0–52.0)
Hemoglobin: 12.9 g/dL — ABNORMAL LOW (ref 13.0–17.0)
MCH: 32.6 pg (ref 26.0–34.0)
MCHC: 34.1 g/dL (ref 30.0–36.0)
MCV: 95.5 fL (ref 78.0–100.0)
Platelets: 283 10*3/uL (ref 150–400)
RBC: 3.96 MIL/uL — ABNORMAL LOW (ref 4.22–5.81)
RDW: 13.5 % (ref 11.5–15.5)
WBC: 7.5 10*3/uL (ref 4.0–10.5)

## 2017-01-23 MED ORDER — SODIUM CHLORIDE 0.9 % IV SOLN
250.0000 mL | INTRAVENOUS | Status: DC
  Administered 2017-01-23: 250 mL via INTRAVENOUS

## 2017-01-23 MED ORDER — PROPOFOL 10 MG/ML IV BOLUS
0.5000 mg/kg | Freq: Once | INTRAVENOUS | Status: DC
  Filled 2017-01-23: qty 20

## 2017-01-23 MED ORDER — SODIUM CHLORIDE 0.9% FLUSH: 3.0000 mL | Freq: Two times a day (BID) | INTRAVENOUS | Status: DC

## 2017-01-23 MED ORDER — SODIUM CHLORIDE 0.9% FLUSH: 3.0000 mL | INTRAVENOUS | Status: DC | PRN

## 2017-01-23 MED ORDER — PROPOFOL 10 MG/ML IV BOLUS
INTRAVENOUS | Status: AC | PRN
  Administered 2017-01-23: 40 mg via INTRAVENOUS

## 2017-01-23 MED ORDER — FENTANYL CITRATE (PF) 100 MCG/2ML IJ SOLN
100.0000 ug | Freq: Once | INTRAMUSCULAR | Status: AC
  Administered 2017-01-23: 100 ug via INTRAVENOUS
  Filled 2017-01-23: qty 2

## 2017-01-23 NOTE — ED Notes (Signed)
Pt in normal sinus rhythm

## 2017-01-23 NOTE — ED Provider Notes (Signed)
[facility] DEPT Provider Note   CSN: [id number] Arrival date & time: 01/23/17  1314     History   Chief Complaint Chief Complaint  Patient presents with  . Atrial Fibrillation    HPI William Stein is a 83 y.o. male.  HPI  Patient, with a past medical history of A. Fib, presents to ED for evaluation of A. Fib. He woke up this morning and states that "I felt a little weird" and when he checked his heart rate was irregular. He also checked his blood pressure and stated that that was normal. He has not been in A. Fib for the past 3 years. He reports compliance with his home Eliquis and amiodarone. He denies any chest pain, shortness of breath, hemoptysis, numbness, headache, vision changes.  Past Medical History:  Diagnosis Date  . Bradycardia    a. during 2009, 2/2 combination of diltiazem and BB  . CKD (chronic kidney disease), stage II   . GERD (gastroesophageal reflux disease)   . HLD (hyperlipidemia)   . HTN (hypertension)   . Noncompliance    a. Prior issue - pt more recently endorses compliance with meds.  Marland Kitchen PAF (paroxysmal atrial fibrillation) ([place]) 2007   a. Previously on flecainide. b. 12/2013: s/p DCCV, started on amio. AC w/ pradaxa.    Patient Active Problem List   Diagnosis Date Noted  . Hyperglycemia 01/09/2014  . CKD (chronic kidney disease), stage II 01/09/2014  . PAF (paroxysmal atrial fibrillation) ([place]) 12/17/2013  . Arthritis 10/23/2013  . Noncompliance 11/07/2011  . Essential hypertension, benign 06/06/2009  . BRADYCARDIA 09/23/2008    Past Surgical History:  Procedure Laterality Date  . CARDIAC CATHETERIZATION  2009  . CARDIOVERSION N/A 12/18/2013   Procedure: CARDIOVERSION;  Surgeon: Lelon Perla, MD;  Location: [facility] ENDOSCOPY;  Service: Cardiovascular;  Laterality: N/A;  . COLONOSCOPY  2003  . DOPPLER ECHOCARDIOGRAPHY  2011  . exercise stress test  2012       Home Medications    Prior to Admission medications   Medication Sig  Start Date End Date Taking? Authorizing Provider  acetaminophen (TYLENOL) 650 MG CR tablet Take 650 mg by mouth 3 (three) times daily.   Yes [provider]  amiodarone (PACERONE) 200 MG tablet TAKE TWO TABLETS BY MOUTH ONCE DAILY 04/04/15  Yes Evans Lance, MD  apixaban (ELIQUIS) 5 MG TABS tablet Take 5 mg by mouth 2 (two) times daily.   Yes [provider]  Cholecalciferol (VITAMIN D-3) 1000 UNITS CAPS Take 1,000 Units by mouth daily.    Yes [provider]  Multiple Vitamin (MULTIVITAMIN) tablet Take 1 tablet by mouth daily.     Yes [provider]  oxybutynin (DITROPAN) 5 MG tablet Take 5 mg by mouth daily.   Yes [provider]  sodium chloride (OCEAN) 0.65 % nasal spray Place 2 sprays into the nose 2 (two) times daily.   Yes [provider]  vitamin C (ASCORBIC ACID) 500 MG tablet Take 500 mg by mouth daily.   Yes [provider]  vitamin E 400 UNIT capsule Take 400 Units by mouth daily.   Yes [provider]    Family History Family History  Problem Relation Age of Onset  . Other Mother 16       old age  . Dementia Father 60  . Coronary artery disease Neg Hx     Social History Social History  Substance Use Topics  . Smoking status: Never Smoker  .  Smokeless tobacco: Never Used  . Alcohol use No     Allergies   Patient has no known allergies.   Review of Systems Review of Systems  Constitutional: Negative for appetite change, chills and fever.  HENT: Negative for ear pain, rhinorrhea, sneezing and sore throat.   Eyes: Negative for photophobia and visual disturbance.  Respiratory: Negative for cough, chest tightness, shortness of breath and wheezing.   Cardiovascular: Positive for palpitations. Negative for chest pain.  Gastrointestinal: Negative for abdominal pain, blood in stool, constipation, diarrhea, nausea and vomiting.  Genitourinary: Negative for dysuria, hematuria and urgency.    Musculoskeletal: Negative for myalgias.  Skin: Negative for rash.  Neurological: Negative for dizziness, weakness and light-headedness.     Physical Exam Updated Vital Signs BP 117/75   Pulse (!) 106   Temp 97.7 F (36.5 C) (Oral)   Resp 15   Ht 5\' 6"  (3.536 m)   Wt 83.9 kg (185 lb)   SpO2 90%   BMI 29.86 kg/m   Physical Exam  Constitutional: He appears well-developed and well-nourished. No distress.  HENT:  Head: Normocephalic and atraumatic.  Nose: Nose normal.  Eyes: Conjunctivae and EOM are normal. Left eye exhibits no discharge. No scleral icterus.  Neck: Normal range of motion. Neck supple.  Cardiovascular: Normal rate, normal heart sounds and intact distal pulses.  An irregularly irregular rhythm present. Exam reveals no gallop and no friction rub.   No murmur heard. Pulmonary/Chest: Effort normal and breath sounds normal. No respiratory distress.  Abdominal: Soft. Bowel sounds are normal. He exhibits no distension. There is no tenderness. There is no guarding.  Musculoskeletal: Normal range of motion. He exhibits no edema.  Neurological: He is alert. He exhibits normal muscle tone. Coordination normal.  Skin: Skin is warm and dry. No rash noted.  Psychiatric: He has a normal mood and affect.  Nursing note and vitals reviewed.    ED Treatments / Results  Labs (all labs ordered are listed, but only abnormal results are displayed) Labs Reviewed  BASIC METABOLIC PANEL - Abnormal; Notable for the following:       Result Value   Glucose, Bld 130 (*)    GFR calc non Af Amer 54 (*)    All other components within normal limits  CBC - Abnormal; Notable for the following:    RBC 3.96 (*)    Hemoglobin 12.9 (*)    HCT 37.8 (*)    All other components within normal limits    EKG  EKG Interpretation  Date/Time:  Sunday January 23 2017 16:53:31 EDT Ventricular Rate:  62 PR Interval:    QRS Duration: 99 QT Interval:  468 QTC Calculation: 476 R  Axis:   61 Text Interpretation:  Sinus rhythm Conversion from atrial fibrillation Borderline prolonged QT interval Confirmed by Virgel Manifold [id number]) on 01/23/2017 4:58:11 PM       Radiology Dg Chest 2 View  Result Date: 01/23/2017 CLINICAL DATA:  AFib. EXAM: CHEST  2 VIEW COMPARISON:  06/29/2014 FINDINGS: Lungs are adequately inflated and otherwise clear. Cardiomediastinal silhouette is within normal. There is calcified plaque over the thoracic aorta. Suggestion of a hiatal hernia. Degenerate change of the spine. IMPRESSION: No acute cardiopulmonary disease. Aortic Atherosclerosis (ICD10-I70.0). Electronically Signed   By: Marin Olp M.D.   On: 01/23/2017 13:52    Procedures .Cardioversion Date/Time: 01/23/2017 4:52 PM Performed by: Delia Heady Authorized by: Delia Heady   Consent:    Consent obtained:  Verbal and written  Consent given by:  Patient   Risks discussed:  Cutaneous burn and death   Alternatives discussed:  No treatment Pre-procedure details:    Rhythm:  Atrial fibrillation   Electrode placement:  Anterior-posterior Attempt one:    Cardioversion mode:  Synchronous   Waveform:  Biphasic   Shock (Joules):  150   Shock outcome:  Conversion to normal sinus rhythm Post-procedure details:    Patient status:  Awake   Patient tolerance of procedure:  Tolerated well, no immediate complications   (including critical care time) CRITICAL CARE Performed by: Delia Heady   history Total critical care time: 35 minutes  Critical care time was exclusive of separately billable procedures and treating other patients.  Critical care was necessary to treat or prevent imminent or life-threatening deterioration.  Critical care was time spent personally by me on the following activities: development of treatment plan with patient and/or surrogate as well as nursing, discussions with consultants, evaluation of patient's response to treatment, examination of patient, obtaining  history from patient or surrogate, ordering and performing treatments and interventions, ordering and review of laboratory studies, ordering and review of radiographic studies, pulse oximetry and re-evaluation of patient's condition.   Medications Ordered in ED Medications  sodium chloride flush (NS) 0.9 % injection 3 mL (not administered)  sodium chloride flush (NS) 0.9 % injection 3 mL (not administered)  0.9 %  sodium chloride infusion (250 mLs Intravenous New Bag/Given 01/23/17 1620)  propofol (DIPRIVAN) 10 mg/mL bolus/IV push 42 mg (not administered)  fentaNYL (SUBLIMAZE) injection 100 mcg (100 mcg Intravenous Given 01/23/17 1628)  propofol (DIPRIVAN) 10 mg/mL bolus/IV push (40 mg Intravenous Given 01/23/17 1630)     Initial Impression / Assessment and Plan / ED Course  I have reviewed the triage vital signs and the nursing notes.  Pertinent labs & imaging results that were available during my care of the patient were reviewed by me and considered in my medical decision making (see chart for details).     Patient presents to the ED for evaluation of A. Fib. He does have a history of A. Fib but has been in normal sinus rhythm for the past 3 years. He reports compliance with his home amiodarone and Eliquis. Denies any other symptoms at this time including no chest pain, shortness of breath, numbness. He is tachycardic to 120s here in A. Fib. Patient was electrically cardioverted with successful conversion to normal sinus rhythm. Will discharge patient once back to baseline with follow-up to PCP for further evaluation. Patient discussed with and seen by Dr. Wilson Singer.   Final Clinical Impressions(s) / ED Diagnoses   Final diagnoses:  Atrial fibrillation with RVR [facility])    New Prescriptions New Prescriptions   No medications on file     Delia Heady, Hershal Coria 01/23/17 Docia Furl, MD 01/25/17 1415

## 2017-01-23 NOTE — ED Provider Notes (Signed)
Medical screening examination/treatment/procedure(s) were conducted as a shared visit with non-physician practitioner(s) and myself.  I personally evaluated the patient during the encounter.   EKG Interpretation  Date/Time:  Sunday January 23 2017 13:21:43 EDT Ventricular Rate:  127 PR Interval:    QRS Duration: 88 QT Interval:  328 QTC Calculation: 476 R Axis:   53 Text Interpretation:  Atrial fibrillation with rapid ventricular response Abnormal ECG Confirmed by Ayodele Sangalang ([id number]) on 01/23/2017 2:06:28 PM      83  year old male with atrial fibrillation with rapid ventricular rate. Past history A. fib. He is anticoagulated on eliquis and reports compliance. Symptom onset within the past 24 hours. Will cardiovert. Pros/cons/alternatives discussed. He has been cardioverted twice previously and would like to proceed. He has established cardiology care through the [facility] system. Assuming restoration of normal sinus rhythm, will follow-up with them.   Procedural Sedation  Preprocedure  Pre-anesthesia/induction confirmation of laterality/correct procedure site including "time-out."  Provider confirms review of the nurses' note, allergies, medications, pertinent labs, PMH, pre-induction vital signs, pulse oximetry, pain level, and ECG (as applicable), and patient condition satisfactory for commencing with order for sedation and procedure.  Medications : propofol, fentanyl  Patient tolerated procedure and procedural sedation component as expected. He did desaturate briefly to 88%. Oxygen was increased and stimulated with quick improvement.  Physician confirms procedural medication orders as administered, patient was assessed by physician post-procedure, and confirms post-sedation plan of care and disposition.  Total time of sedation/monitoring: 35 minutes   Converted to NSR. Feels better and no other complaints.        Virgel Manifold, MD 01/23/17 [phone]

## 2017-01-23 NOTE — ED Triage Notes (Signed)
Pt states he thinks he is in afib and states he has not been in afib in 4 years.  Pt is on an anticoagulant- Eliquis. No sob or chest pain

## 2017-01-23 NOTE — Progress Notes (Addendum)
RT at bedside for cardioversion.  ETCO2 noted at 34 prior to cardioversion.  Pt desated to 88% and was placed on 100% NRB.

## 2017-01-23 NOTE — Discharge Instructions (Signed)
Please read attached information regarding your condition. Follow up with your primary care provider for further evaluation. Return to ED for chest pain, trouble breathing, leg swelling, vision changes, numbness, weakness, head injuries.

## 2017-01-24 ENCOUNTER — Telehealth (HOSPITAL_COMMUNITY): Payer: Self-pay | Admitting: *Deleted

## 2017-01-24 NOTE — Telephone Encounter (Signed)
I cld pt regarding recent ED visit for afib. Pt was cardioverted in the ED and was told to follow up with [facility] clinic.  I cld pt to make sure pt has made f/u or to offer appt for f/u in afib clinic.   Pt stated that he would rather f/u with his physician but that if he is unable to get an appt with them he will call us for follow up

## 2017-01-26 ENCOUNTER — Ambulatory Visit (HOSPITAL_COMMUNITY)
Admission: RE | Admit: 2017-01-26 | Discharge: 2017-01-26 | Disposition: A | Discharge status: Home / Self Care | Payer: Medicare Other | Source: Ambulatory Visit | Attending: Nurse Practitioner | Admitting: Nurse Practitioner

## 2017-01-26 ENCOUNTER — Other Ambulatory Visit: Payer: Self-pay

## 2017-01-26 ENCOUNTER — Encounter (HOSPITAL_COMMUNITY): Payer: Self-pay | Admitting: Nurse Practitioner

## 2017-01-26 VITALS — BP 136/74 | HR 55 | Ht 66.0 in | Wt 180.8 lb

## 2017-01-26 DIAGNOSIS — E785 Hyperlipidemia, unspecified: Secondary | ICD-10-CM | POA: Diagnosis not present

## 2017-01-26 DIAGNOSIS — I480 Paroxysmal atrial fibrillation: Secondary | ICD-10-CM

## 2017-01-26 DIAGNOSIS — Z8489 Family history of other specified conditions: Secondary | ICD-10-CM | POA: Insufficient documentation

## 2017-01-26 DIAGNOSIS — I129 Hypertensive chronic kidney disease with stage 1 through stage 4 chronic kidney disease, or unspecified chronic kidney disease: Secondary | ICD-10-CM | POA: Insufficient documentation

## 2017-01-26 DIAGNOSIS — Z820 Family history of epilepsy and other diseases of the nervous system: Secondary | ICD-10-CM | POA: Diagnosis not present

## 2017-01-26 DIAGNOSIS — Z79899 Other long term (current) drug therapy: Secondary | ICD-10-CM | POA: Diagnosis not present

## 2017-01-26 DIAGNOSIS — N182 Chronic kidney disease, stage 2 (mild): Secondary | ICD-10-CM | POA: Insufficient documentation

## 2017-01-26 DIAGNOSIS — Z7901 Long term (current) use of anticoagulants: Secondary | ICD-10-CM | POA: Diagnosis not present

## 2017-01-26 DIAGNOSIS — R001 Bradycardia, unspecified: Secondary | ICD-10-CM | POA: Diagnosis not present

## 2017-01-26 DIAGNOSIS — Z9889 Other specified postprocedural states: Secondary | ICD-10-CM | POA: Insufficient documentation

## 2017-01-26 DIAGNOSIS — K219 Gastro-esophageal reflux disease without esophagitis: Secondary | ICD-10-CM | POA: Diagnosis not present

## 2017-01-26 NOTE — Progress Notes (Signed)
Primary Care Physician: [facility] Referring Physician: [facility] ER   William Stein is a 83 y.o. male with a h/o afib maintaining SR on amiodarone, that usually gets his care thru the [place]. He suddenly developed a rapid heart rate and presented to  the ER 9/23 and was found to be in afb with RVR. He was successfully cardioverted. He is on apixaban 5 mg bid for a chadsvasc score of at least 3. He had failed flecainide in the past. He does not know of any triggers.  Today, he denies symptoms of palpitations, chest pain, shortness of breath, orthopnea, PND, lower extremity edema, dizziness, presyncope, syncope, or neurologic sequela. The patient is tolerating medications without difficulties and is otherwise without complaint today.   Past Medical History:  Diagnosis Date  . Bradycardia    a. during 2009, 2/2 combination of diltiazem and BB  . CKD (chronic kidney disease), stage II   . GERD (gastroesophageal reflux disease)   . HLD (hyperlipidemia)   . HTN (hypertension)   . Noncompliance    a. Prior issue - pt more recently endorses compliance with meds.  Marland Kitchen PAF (paroxysmal atrial fibrillation) ([place]) 2007   a. Previously on flecainide. b. 12/2013: s/p DCCV, started on amio. AC w/ pradaxa.   Past Surgical History:  Procedure Laterality Date  . CARDIAC CATHETERIZATION  2009  . CARDIOVERSION N/A 12/18/2013   Procedure: CARDIOVERSION;  Surgeon: Lelon Perla, MD;  Location: [facility] ENDOSCOPY;  Service: Cardiovascular;  Laterality: N/A;  . COLONOSCOPY  2003  . DOPPLER ECHOCARDIOGRAPHY  2011  . exercise stress test  2012    Current Outpatient Prescriptions  Medication Sig Dispense Refill  . acetaminophen (TYLENOL) 650 MG CR tablet Take 650 mg by mouth 3 (three) times daily.    Marland Kitchen amiodarone (PACERONE) 200 MG tablet Take 200 mg by mouth as directed. Take one tablet daily Monday-Friday and 1/2 tablet on Sat and Sun    . apixaban (ELIQUIS) 5 MG TABS tablet Take 5 mg by mouth 2 (two)  times daily.    . Cholecalciferol (VITAMIN D-3) 1000 UNITS CAPS Take 1,000 Units by mouth daily.     . Multiple Vitamin (MULTIVITAMIN) tablet Take 1 tablet by mouth daily.      Marland Kitchen oxybutynin (DITROPAN) 5 MG tablet Take 5 mg by mouth daily.    . sodium chloride (OCEAN) 0.65 % nasal spray Place 2 sprays into the nose 2 (two) times daily.    . vitamin C (ASCORBIC ACID) 500 MG tablet Take 500 mg by mouth daily.    . vitamin E 400 UNIT capsule Take 400 Units by mouth daily.     No current facility-administered medications for this encounter.     No Known Allergies  Social History   Social History  . Marital status: Widowed    Spouse name: N/A  . Number of children: N/A  . Years of education: N/A   Occupational History  . Full time [employer]   Social History Main Topics  . Smoking status: Never Smoker  . Smokeless tobacco: Never Used  . Alcohol use No  . Drug use: No  . Sexual activity: Not on file   Other Topics Concern  . Not on file   Social History Narrative   Widowed     Family History  Problem Relation Age of Onset  . Other Mother 37       old age  . Dementia Father 6  . Coronary  artery disease Neg Hx     ROS- All systems are reviewed and negative except as per the HPI above  Physical Exam: Vitals:   01/26/17 1400  BP: 136/74  Pulse: (!) 55  Weight: 180 lb 12.8 oz (82 kg)  Height: 5\' 6"  (1.676 m)   Wt Readings from Last 3 Encounters:  01/26/17 180 lb 12.8 oz (82 kg)  01/23/17 185 lb (83.9 kg)  09/26/14 193 lb 12.8 oz (87.9 kg)    Labs: Lab Results  Component Value Date   NA 135 01/23/2017   K 4.5 01/23/2017   CL 104 01/23/2017   CO2 24 01/23/2017   GLUCOSE 130 (H) 01/23/2017   BUN 15 01/23/2017   CREATININE 1.20 01/23/2017   CALCIUM 8.9 01/23/2017   MG 2.3 12/17/2013   Lab Results  Component Value Date   INR 1.09 10/23/2013   Lab Results  Component Value Date   CHOL 143 11/07/2011   HDL 33 (L) 11/07/2011     LDLCALC 90 11/07/2011   TRIG 98 11/07/2011     GEN- The patient is well appearing, alert and oriented x 3 today.   Head- normocephalic, atraumatic Eyes-  Sclera clear, conjunctiva pink Ears- hearing intact Oropharynx- clear Neck- supple, no JVP Lymph- no cervical lymphadenopathy Lungs- Clear to ausculation bilaterally, normal work of breathing Heart- Regular rate and rhythm, no murmurs, rubs or gallops, PMI not laterally displaced GI- soft, NT, ND, + BS Extremities- no clubbing, cyanosis, or edema MS- no significant deformity or atrophy Skin- no rash or lesion Psych- euthymic mood, full affect Neuro- strength and sensation are intact  EKG- Sinus brady at 55 bpm, pr int 174 ms, qrs int 84 ms, qtc 438 ms Epic records reviewed    Assessment and Plan: 1. [facility]ib [facility]  Recent break thorough afib with successful cardioversion 9/23 He will continue amiodarone 200 mg a day Continue apixaban 5 mg bid for chadsvasc score of at least 3, reminded to be extra careful and not miss any doses for the next 30 days following cardioversion Encouraged to [facility] as needed  Butch Penny C. Carroll, [facility] [address] [place], [place] [address] [phone]

## 2017-06-23 ENCOUNTER — Emergency Department (HOSPITAL_COMMUNITY): Payer: Medicare Other

## 2017-06-23 ENCOUNTER — Encounter (HOSPITAL_COMMUNITY): Payer: Self-pay

## 2017-06-23 ENCOUNTER — Other Ambulatory Visit: Payer: Self-pay

## 2017-06-23 ENCOUNTER — Emergency Department (HOSPITAL_COMMUNITY)
Admission: EM | Admit: 2017-06-23 | Discharge: 2017-06-23 | Disposition: A | Discharge status: Home / Self Care | Payer: Medicare Other | Attending: Emergency Medicine | Admitting: Emergency Medicine

## 2017-06-23 DIAGNOSIS — R002 Palpitations: Secondary | ICD-10-CM | POA: Diagnosis not present

## 2017-06-23 DIAGNOSIS — Z7901 Long term (current) use of anticoagulants: Secondary | ICD-10-CM | POA: Diagnosis not present

## 2017-06-23 DIAGNOSIS — I480 Paroxysmal atrial fibrillation: Secondary | ICD-10-CM | POA: Insufficient documentation

## 2017-06-23 DIAGNOSIS — N182 Chronic kidney disease, stage 2 (mild): Secondary | ICD-10-CM | POA: Insufficient documentation

## 2017-06-23 DIAGNOSIS — R0602 Shortness of breath: Secondary | ICD-10-CM | POA: Diagnosis not present

## 2017-06-23 DIAGNOSIS — R079 Chest pain, unspecified: Secondary | ICD-10-CM | POA: Diagnosis not present

## 2017-06-23 DIAGNOSIS — Z79899 Other long term (current) drug therapy: Secondary | ICD-10-CM | POA: Insufficient documentation

## 2017-06-23 DIAGNOSIS — E785 Hyperlipidemia, unspecified: Secondary | ICD-10-CM | POA: Insufficient documentation

## 2017-06-23 DIAGNOSIS — I129 Hypertensive chronic kidney disease with stage 1 through stage 4 chronic kidney disease, or unspecified chronic kidney disease: Secondary | ICD-10-CM | POA: Diagnosis not present

## 2017-06-23 LAB — CBC
HCT: 39.2 % (ref 39.0–52.0)
HEMOGLOBIN: 13.7 g/dL (ref 13.0–17.0)
MCH: 33.3 pg (ref 26.0–34.0)
MCHC: 34.9 g/dL (ref 30.0–36.0)
MCV: 95.4 fL (ref 78.0–100.0)
PLATELETS: 256 10*3/uL (ref 150–400)
RBC: 4.11 MIL/uL — AB (ref 4.22–5.81)
RDW: 13.1 % (ref 11.5–15.5)
WBC: 8.3 10*3/uL (ref 4.0–10.5)

## 2017-06-23 LAB — BASIC METABOLIC PANEL
ANION GAP: 9 (ref 5–15)
BUN: 17 mg/dL (ref 6–20)
CALCIUM: 9 mg/dL (ref 8.9–10.3)
CHLORIDE: 105 mmol/L (ref 101–111)
CO2: 21 mmol/L — ABNORMAL LOW (ref 22–32)
CREATININE: 0.97 mg/dL (ref 0.61–1.24)
GFR calc non Af Amer: >60 mL/min (ref >60)
Glucose, Bld: 98 mg/dL (ref 65–99)
Potassium: 4.2 mmol/L (ref 3.5–5.1)
SODIUM: 135 mmol/L (ref 135–145)

## 2017-06-23 LAB — I-STAT TROPONIN, ED: TROPONIN I, POC: 0 ng/mL (ref 0.00–0.08)

## 2017-06-23 NOTE — ED Provider Notes (Signed)
  Face-to-face evaluation   History: Patient has been having intermittent periods of irregular heartbeat, with a period of low heart rate, "40" this morning.  Physical exam: Elderly, frail.  Heart regular rate and rhythm.  Lungs clear.  He is lucid.  Medical screening examination/treatment/procedure(s) were conducted as a shared visit with non-physician practitioner(s) and myself.  I personally evaluated the patient during the encounter    Daleen Bo, MD 06/24/17 1025

## 2017-06-23 NOTE — ED Triage Notes (Signed)
Pt endorses his HR was 40 this morning with occasional shob, has hx of a-fib and several previous electrical cardioversions. Pt takes amioderone and eliquis for a-fib. Pt denies CP. Pt states that his BP has been out of control systolic.

## 2017-06-23 NOTE — Discharge Instructions (Signed)
Please schedule an appointment with your Cardiologist regarding your visit today. You had no episodes of atrial fibrillation here today. Continue taking your medications as prescribed and monitoring your vital signs. That will be helpful for your Cardiologist. Return to the ER for chest pain, or new or concerning symptoms.

## 2017-06-23 NOTE — ED Notes (Signed)
Pt stable, ambulatory, states understanding of discharge instructions 

## 2017-06-23 NOTE — ED Provider Notes (Signed)
[facility] Provider Note   CSN: [id number] Arrival date & time: 06/23/17  1449     History   Chief Complaint Chief Complaint  Patient presents with  . Chest Pain  . Atrial Fibrillation    HPI William Stein is a 84 y.o. male with past medical history of hypertension, hyperlipidemia, CKD, paroxysmal A. fib on Eliquis, presenting to the ED with complaints of going in and out of atrial fibrillation.  Patient states he has been recording his vital signs, and notes that he had episode of low heart rate, 40 bpm, that occurred yesterday.  Patient states he feels like he is going in and out of atrial fibrillation because he has a weakness and an odd sensation in his chest.  He states he feels his pulse during that time and feels that it is irregular rhythm.  States he feels mildly short of breath during these episodes, which do not last very long.  Contrary to triage note, patient denies chest pain, stating "never."  Has no lower extremity edema, or any other complaints.  Patient is compliant with his amiodarone as well that he takes for rate control, with history of atrial fibrillation with RVR.   The history is provided by the patient.    Past Medical History:  Diagnosis Date  . Bradycardia    a. during 2009, 2/2 combination of diltiazem and BB  . CKD (chronic kidney disease), stage II   . GERD (gastroesophageal reflux disease)   . HLD (hyperlipidemia)   . HTN (hypertension)   . Noncompliance    a. Prior issue - pt more recently endorses compliance with meds.  Marland Kitchen PAF (paroxysmal atrial fibrillation) ([place]) 2007   a. Previously on flecainide. b. 12/2013: s/p DCCV, started on amio. AC w/ pradaxa.    Patient Active Problem List   Diagnosis Date Noted  . Hyperglycemia 01/09/2014  . CKD (chronic kidney disease), stage II 01/09/2014  . PAF (paroxysmal atrial fibrillation) ([place]) 12/17/2013  . Arthritis 10/23/2013  . Noncompliance 11/07/2011  .  Essential hypertension, benign 06/06/2009  . BRADYCARDIA 09/23/2008    Past Surgical History:  Procedure Laterality Date  . CARDIAC CATHETERIZATION  2009  . CARDIOVERSION N/A 12/18/2013   Procedure: CARDIOVERSION;  Surgeon: Lelon Perla, MD;  Location: [facility] ENDOSCOPY;  Service: Cardiovascular;  Laterality: N/A;  . COLONOSCOPY  2003  . DOPPLER ECHOCARDIOGRAPHY  2011  . exercise stress test  2012       Home Medications    Prior to Admission medications   Medication Sig Start Date End Date Taking? Authorizing Provider  acetaminophen (TYLENOL) 650 MG CR tablet Take 650-1,300 mg by mouth See admin instructions. Take two tablets (1300 mg) by mouth every morning, may also take one tablet (650 mg) later in the day as needed for pain   Yes [provider]  amiodarone (PACERONE) 200 MG tablet Take 100-200 mg by mouth See admin instructions. Take 1/2 tablet (100 mg) by mouth on Saturday and Sunday morning, take 1 tablet (200 mg) on Monday thru Friday mornings   Yes [provider]  apixaban (ELIQUIS) 5 MG TABS tablet Take 5 mg by mouth 2 (two) times daily.   Yes [provider]  Cholecalciferol (VITAMIN D-3) 1000 UNITS CAPS Take 1,000 Units by mouth daily.    Yes [provider]  fluticasone (FLONASE) 50 MCG/ACT nasal spray Place 1 spray into both nostrils daily.   Yes [provider]  Menthol, Topical Analgesic, (  ICY HOT) 7.5 % (Roll) MISC Apply 1 each topically 4 (four) times daily as needed (pain).   Yes [provider]  Multiple Vitamin (MULTIVITAMIN WITH MINERALS) TABS tablet Take 1 tablet by mouth daily.   Yes [provider]  oxybutynin (DITROPAN) 5 MG tablet Take 5 mg by mouth at bedtime.    Yes [provider]  vitamin C (ASCORBIC ACID) 500 MG tablet Take 500 mg by mouth daily.   Yes [provider]  vitamin E 400 UNIT capsule Take 400 Units by mouth daily.   Yes [provider]  diltiazem (DILACOR  XR) 180 MG 24 hr capsule Take 1 capsule (180 mg total) by mouth daily. 11/07/11 06/12/12  Theora Gianotti, NP    Family History Family History  Problem Relation Age of Onset  . Other Mother 64       old age  . Dementia Father 37  . Coronary artery disease Neg Hx     Social History Social History   Tobacco Use  . Smoking status: Never Smoker  . Smokeless tobacco: Never Used  Substance Use Topics  . Alcohol use: No  . Drug use: No     Allergies   Patient has no known allergies.   Review of Systems Review of Systems  Respiratory: Positive for shortness of breath (intermittent).   Cardiovascular: Positive for palpitations. Negative for chest pain and leg swelling.  Hematological: Bruises/bleeds easily (eliquis).  All other systems reviewed and are negative.    Physical Exam Updated Vital Signs BP (!) 156/72   Pulse 63   Temp 97.9 F (36.6 C) (Oral)   Resp 14   Ht 5\' 7"  (1.702 m)   Wt 83.5 kg (184 lb)   SpO2 100%   BMI 28.82 kg/m   Physical Exam  Constitutional: He is oriented to person, place, and time. He appears well-developed and well-nourished. No distress.  Well-appearing.  HENT:  Head: Normocephalic and atraumatic.  Mouth/Throat: Oropharynx is clear and moist.  Eyes: Conjunctivae and EOM are normal. Pupils are equal, round, and reactive to light.  Neck: Normal range of motion. Neck supple.  Cardiovascular: Normal rate, regular rhythm, normal heart sounds and intact distal pulses. Exam reveals no gallop and no friction rub.  No murmur heard. Pulmonary/Chest: Effort normal and breath sounds normal. No stridor. No respiratory distress. He has no wheezes. He has no rales.  Abdominal: Soft. Bowel sounds are normal. He exhibits no distension. There is no tenderness. There is no guarding.  Musculoskeletal:  No LE edema or tenderness  Neurological: He is alert and oriented to person, place, and time.  Skin: Skin is warm.  Psychiatric: He has a normal  mood and affect. His behavior is normal.  Nursing note and vitals reviewed.    ED Treatments / Results  Labs (all labs ordered are listed, but only abnormal results are displayed) Labs Reviewed  BASIC METABOLIC PANEL - Abnormal; Notable for the following components:      Result Value   CO2 21 (*)    All other components within normal limits  CBC - Abnormal; Notable for the following components:   RBC 4.11 (*)    All other components within normal limits  I-STAT TROPONIN, ED    EKG  EKG Interpretation  Date/Time:  Thursday June 23 2017 14:55:03 EST Ventricular Rate:  63 PR Interval:  170 QRS Duration: 86 QT Interval:  434 QTC Calculation: 444 R Axis:   48 Text Interpretation:  Sinus  rhythm with occasional Premature ventricular complexes Nonspecific ST abnormality Abnormal ECG Since last tracing rate faster Confirmed by Daleen Bo [phone]) on 06/23/2017 7:02:01 PM       Radiology Dg Chest 2 View  Result Date: 06/23/2017 CLINICAL DATA:  Shortness of breath. History of atrial fibrillation. EXAM: CHEST  2 VIEW COMPARISON:  01/23/2017. FINDINGS: Mediastinum and hilar structures normal. Lungs are clear. No pleural effusion or pneumothorax. Sliding hiatal hernia. Mild bilateral pleural thickening consistent scarring again noted. No pneumothorax. IMPRESSION: 1. No acute cardiopulmonary disease. Mild bilateral pleural thickening consistent with scarring again noted. 2.  Sliding hiatal hernia. Electronically Signed   By: [name]  Register   On: 06/23/2017 15:43    Procedures Procedures (including critical care time)  Medications Ordered in ED Medications - No data to display   Initial Impression / Assessment and Plan / ED Course  I have reviewed the triage vital signs and the nursing notes.  Pertinent labs & imaging results that were available during my care of the patient were reviewed by me and considered in my medical decision making (see chart for details).     Pt  presenting with complaint of intermittent atrial fibrillation and feeling as "off" during episodes. Pt on eliquis and amiodarone, with hx of a fib with RVR. Pt adamantly denies CP. Initial EKG and cardiac monitoring reveals sinus rhythm with occasional PVCs. Pt is well-appearing, not in distress. CBC and CMP unremarkable. Trop neg. CXR neg for acute pathology. Pt discussed with and evaluated by Dr. Eulis Foster, who agrees with discharge. Recommend follow up with his cardiologist regarding visit today.   Discussed results, findings, treatment and follow up. Patient advised of return precautions. Patient verbalized understanding and agreed with plan.  Final Clinical Impressions(s) / ED Diagnoses   Final diagnoses:  Palpitations    ED Discharge Orders    None       Javon Hupfer, [place] N, PA-C 06/23/17 2036    Daleen Bo, MD 06/24/17 1025

## 2017-06-23 NOTE — ED Provider Notes (Signed)
Patient placed in Quick Look pathway, seen and evaluated   Chief Complaint: chest pain, A-fib  HPI:   Patient reports feeling tired and feeling like heart is not beating like it should. Also feeling short of breath.   ROS: Resp: short of breath  CV: chest pain, palpations   Physical Exam:  BP (!) 125/98 (BP Location: Right Arm)   Pulse 64   Temp 97.9 F (36.6 C) (Oral)   Resp 16   Ht 5\' 7"  (1.702 m)   Wt 83.5 kg (184 lb)   SpO2 100%   BMI 28.82 kg/m    Gen: No distress  Neuro: Awake and Alert  Skin: Warm and dry  Heart: PVC's    Focused Exam:    Initiation of care has begun. The patient has been counseled on the process, plan, and necessity for staying for the completion/evaluation, and the remainder of the medical screening examination     Ashley Murrain, NP 06/23/17 1504    Daleen Bo, MD 06/24/17 1025

## 2018-03-28 DIAGNOSIS — M545 Low back pain: Secondary | ICD-10-CM | POA: Diagnosis not present

## 2018-03-28 DIAGNOSIS — S2232XA Fracture of one rib, left side, initial encounter for closed fracture: Secondary | ICD-10-CM | POA: Diagnosis not present

## 2018-03-29 ENCOUNTER — Other Ambulatory Visit: Payer: Self-pay

## 2018-03-29 ENCOUNTER — Emergency Department (HOSPITAL_BASED_OUTPATIENT_CLINIC_OR_DEPARTMENT_OTHER)
Admission: EM | Admit: 2018-03-29 | Discharge: 2018-03-29 | Disposition: A | Discharge status: Home / Self Care | Payer: Medicare Other | Attending: Emergency Medicine | Admitting: Emergency Medicine

## 2018-03-29 ENCOUNTER — Encounter (HOSPITAL_BASED_OUTPATIENT_CLINIC_OR_DEPARTMENT_OTHER): Payer: Self-pay | Admitting: *Deleted

## 2018-03-29 ENCOUNTER — Emergency Department (HOSPITAL_BASED_OUTPATIENT_CLINIC_OR_DEPARTMENT_OTHER): Payer: Medicare Other

## 2018-03-29 DIAGNOSIS — N390 Urinary tract infection, site not specified: Secondary | ICD-10-CM

## 2018-03-29 DIAGNOSIS — W06XXXA Fall from bed, initial encounter: Secondary | ICD-10-CM | POA: Insufficient documentation

## 2018-03-29 DIAGNOSIS — Y998 Other external cause status: Secondary | ICD-10-CM | POA: Insufficient documentation

## 2018-03-29 DIAGNOSIS — S301XXA Contusion of abdominal wall, initial encounter: Secondary | ICD-10-CM | POA: Insufficient documentation

## 2018-03-29 DIAGNOSIS — N182 Chronic kidney disease, stage 2 (mild): Secondary | ICD-10-CM | POA: Insufficient documentation

## 2018-03-29 DIAGNOSIS — S3991XA Unspecified injury of abdomen, initial encounter: Secondary | ICD-10-CM | POA: Diagnosis not present

## 2018-03-29 DIAGNOSIS — Y92003 Bedroom of unspecified non-institutional (private) residence as the place of occurrence of the external cause: Secondary | ICD-10-CM | POA: Diagnosis not present

## 2018-03-29 DIAGNOSIS — Z79899 Other long term (current) drug therapy: Secondary | ICD-10-CM | POA: Insufficient documentation

## 2018-03-29 DIAGNOSIS — S2232XA Fracture of one rib, left side, initial encounter for closed fracture: Secondary | ICD-10-CM

## 2018-03-29 DIAGNOSIS — Y9389 Activity, other specified: Secondary | ICD-10-CM | POA: Diagnosis not present

## 2018-03-29 DIAGNOSIS — Y92009 Unspecified place in unspecified non-institutional (private) residence as the place of occurrence of the external cause: Secondary | ICD-10-CM

## 2018-03-29 DIAGNOSIS — S299XXA Unspecified injury of thorax, initial encounter: Secondary | ICD-10-CM | POA: Diagnosis present

## 2018-03-29 DIAGNOSIS — W19XXXA Unspecified fall, initial encounter: Secondary | ICD-10-CM

## 2018-03-29 DIAGNOSIS — Z7901 Long term (current) use of anticoagulants: Secondary | ICD-10-CM | POA: Diagnosis not present

## 2018-03-29 DIAGNOSIS — I129 Hypertensive chronic kidney disease with stage 1 through stage 4 chronic kidney disease, or unspecified chronic kidney disease: Secondary | ICD-10-CM | POA: Insufficient documentation

## 2018-03-29 LAB — URINALYSIS, MICROSCOPIC (REFLEX): WBC UA: NONE SEEN WBC/hpf (ref 0–5)

## 2018-03-29 LAB — BASIC METABOLIC PANEL
ANION GAP: 6 (ref 5–15)
BUN: 23 mg/dL (ref 8–23)
CO2: 24 mmol/L (ref 22–32)
Calcium: 8.9 mg/dL (ref 8.9–10.3)
Chloride: 106 mmol/L (ref 98–111)
Creatinine, Ser: 1.1 mg/dL (ref 0.61–1.24)
Glucose, Bld: 109 mg/dL — ABNORMAL HIGH (ref 70–99)
Potassium: 4.4 mmol/L (ref 3.5–5.1)
SODIUM: 136 mmol/L (ref 135–145)

## 2018-03-29 LAB — URINALYSIS, ROUTINE W REFLEX MICROSCOPIC
Bilirubin Urine: NEGATIVE
Glucose, UA: NEGATIVE mg/dL
HGB URINE DIPSTICK: NEGATIVE
Ketones, ur: 15 mg/dL — AB
Leukocytes, UA: NEGATIVE
NITRITE: POSITIVE — AB
PROTEIN: NEGATIVE mg/dL
Specific Gravity, Urine: 1.025 (ref 1.005–1.030)
pH: 6 (ref 5.0–8.0)

## 2018-03-29 LAB — CBC
HEMATOCRIT: 36.2 % — AB (ref 39.0–52.0)
HEMOGLOBIN: 12 g/dL — AB (ref 13.0–17.0)
MCH: 33.4 pg (ref 26.0–34.0)
MCHC: 33.1 g/dL (ref 30.0–36.0)
MCV: 100.8 fL — AB (ref 80.0–100.0)
PLATELETS: 258 10*3/uL (ref 150–400)
RBC: 3.59 MIL/uL — AB (ref 4.22–5.81)
RDW: 13 % (ref 11.5–15.5)
WBC: 8.6 10*3/uL (ref 4.0–10.5)
nRBC: 0 % (ref 0.0–0.2)

## 2018-03-29 MED ORDER — SODIUM CHLORIDE 0.9 % IV SOLN
INTRAVENOUS | Status: DC | PRN
  Administered 2018-03-29: 500 mL via INTRAVENOUS

## 2018-03-29 MED ORDER — IOPAMIDOL (ISOVUE-300) INJECTION 61%
100.0000 mL | Freq: Once | INTRAVENOUS | Status: AC | PRN
  Administered 2018-03-29: 100 mL via INTRAVENOUS

## 2018-03-29 MED ORDER — SODIUM CHLORIDE 0.9 % IV SOLN
1.0000 g | Freq: Once | INTRAVENOUS | Status: AC
  Administered 2018-03-29: 1 g via INTRAVENOUS
  Filled 2018-03-29: qty 10

## 2018-03-29 MED ORDER — HYDROCODONE-ACETAMINOPHEN 5-325 MG PO TABS: 1.0000 | ORAL_TABLET | Freq: Three times a day (TID) | ORAL | 0 refills | Status: DC | PRN

## 2018-03-29 MED ORDER — CEPHALEXIN 500 MG PO CAPS: 500.0000 mg | ORAL_CAPSULE | Freq: Two times a day (BID) | ORAL | 0 refills | Status: AC

## 2018-03-29 NOTE — ED Triage Notes (Signed)
Pt fell 2-3 days ago. Was seen at Urgent Care yesterday and dx with probable rib fracture. They were advised to come to the ED for CT to confirm. Pt has took Norco around [employer]

## 2018-03-29 NOTE — ED Notes (Signed)
ED Provider at bedside. 

## 2018-03-29 NOTE — ED Notes (Signed)
Patient transported to CT 

## 2018-03-29 NOTE — ED Provider Notes (Signed)
[facility] EMERGENCY DEPARTMENT Provider Note   CSN: [id number] Arrival date & time: 03/29/18  1602     History   Chief Complaint Chief Complaint  Patient presents with  . Fall    HPI William Stein is a 84 y.o. male.  84 year old male with past medical history including atrial fibrillation on Eliquis, CKD, GERD, hypertension, hyperlipidemia who presents with fall and rib pain.  Patient states that 2 to 3 days ago, he fell out of bed and landed on his left side, striking his left lateral chest wall on a step.  He did not hit his head or lose consciousness.  He has had pain on this left side since the fall.  Pain is worse with deep inspiration, coughing, and laughing.  He denies any shortness of breath at rest.  He was seen at urgent care yesterday where they did x-rays and diagnosed him with a probable rib fracture.  They gave him pain medications which he states has been improving his pain.  Son states that they were told to come to the ER for further testing.  He last took pain medication around 3 PM today.  He denies any extremity injuries, abdominal pain, or other areas of injury.  He has chronic back pain due to degenerative disc disease and states that this is relatively unchanged since the fall.  The history is provided by the patient and a relative.  Fall     Past Medical History:  Diagnosis Date  . Bradycardia    a. during 2009, 2/2 combination of diltiazem and BB  . CKD (chronic kidney disease), stage II   . GERD (gastroesophageal reflux disease)   . HLD (hyperlipidemia)   . HTN (hypertension)   . Noncompliance    a. Prior issue - pt more recently endorses compliance with meds.  Marland Kitchen PAF (paroxysmal atrial fibrillation) ([place]) 2007   a. Previously on flecainide. b. 12/2013: s/p DCCV, started on amio. AC w/ pradaxa.    Patient Active Problem List   Diagnosis Date Noted  . Hyperglycemia 01/09/2014  . CKD (chronic kidney disease), stage II 01/09/2014  .  PAF (paroxysmal atrial fibrillation) ([place]) 12/17/2013  . Arthritis 10/23/2013  . Noncompliance 11/07/2011  . Essential hypertension, benign 06/06/2009  . BRADYCARDIA 09/23/2008    Past Surgical History:  Procedure Laterality Date  . CARDIAC CATHETERIZATION  2009  . CARDIOVERSION N/A 12/18/2013   Procedure: CARDIOVERSION;  Surgeon: Lelon Perla, MD;  Location: [facility] ENDOSCOPY;  Service: Cardiovascular;  Laterality: N/A;  . COLONOSCOPY  2003  . DOPPLER ECHOCARDIOGRAPHY  2011  . exercise stress test  2012        Home Medications    Prior to Admission medications   Medication Sig Start Date End Date Taking? Authorizing Provider  acetaminophen (TYLENOL) 650 MG CR tablet Take 650-1,300 mg by mouth See admin instructions. Take two tablets (1300 mg) by mouth every morning, may also take one tablet (650 mg) later in the day as needed for pain    [provider]  amiodarone (PACERONE) 200 MG tablet Take 100-200 mg by mouth See admin instructions. Take 1/2 tablet (100 mg) by mouth on Saturday and Sunday morning, take 1 tablet (200 mg) on Monday thru Friday mornings    [provider]  apixaban (ELIQUIS) 5 MG TABS tablet Take 5 mg by mouth 2 (two) times daily.    [provider]  cephALEXin (KEFLEX) 500 MG capsule Take 1 capsule (500 mg total) by mouth  2 (two) times daily for 7 days. 03/29/18 04/05/18  Little, Wenda Overland, MD  Cholecalciferol (VITAMIN D-3) 1000 UNITS CAPS Take 1,000 Units by mouth daily.     [provider]  fluticasone (FLONASE) 50 MCG/ACT nasal spray Place 1 spray into both nostrils daily.    [provider]  HYDROcodone-acetaminophen (NORCO/VICODIN) 5-325 MG tablet Take 1 tablet by mouth every 8 (eight) hours as needed for severe pain. 03/29/18   Little, Wenda Overland, MD  Menthol, Topical Analgesic, (ICY HOT) 7.5 % (Roll) MISC Apply 1 each topically 4 (four) times daily as needed (pain).    [provider]  Multiple  Vitamin (MULTIVITAMIN WITH MINERALS) TABS tablet Take 1 tablet by mouth daily.    [provider]  oxybutynin (DITROPAN) 5 MG tablet Take 5 mg by mouth at bedtime.     [provider]  vitamin C (ASCORBIC ACID) 500 MG tablet Take 500 mg by mouth daily.    [provider]  vitamin E 400 UNIT capsule Take 400 Units by mouth daily.    [provider]  diltiazem (DILACOR XR) 180 MG 24 hr capsule Take 1 capsule (180 mg total) by mouth daily. 11/07/11 06/12/12  Theora Gianotti, NP    Family History Family History  Problem Relation Age of Onset  . Other Mother 89       old age  . Dementia Father 86  . Coronary artery disease Neg Hx     Social History Social History   Tobacco Use  . Smoking status: Never Smoker  . Smokeless tobacco: Never Used  Substance Use Topics  . Alcohol use: No  . Drug use: No     Allergies   Patient has no known allergies.   Review of Systems Review of Systems All other systems reviewed and are negative except that which was mentioned in HPI   Physical Exam Updated Vital Signs BP 123/74   Pulse 66   Temp 98.7 F (37.1 C) (Oral)   Resp 18   Ht 5\' 10"  (1.778 m)   Wt 81.8 kg   SpO2 98%   BMI 25.89 kg/m   Physical Exam  Constitutional: He is oriented to person, place, and time. He appears well-developed and well-nourished. No distress.  Frail, elderly male awake and alert  HENT:  Head: Normocephalic and atraumatic.  Mouth/Throat: Oropharynx is clear and moist.  Moist mucous membranes  Eyes: Conjunctivae are normal.  Neck: Neck supple.  Cardiovascular: Regular rhythm and normal heart sounds. Bradycardia present.  No murmur heard. Pulmonary/Chest: Effort normal and breath sounds normal. He exhibits tenderness (L lateral chest wall).  Abdominal: Soft. Bowel sounds are normal. He exhibits no distension. There is no tenderness.  Musculoskeletal: He exhibits no edema or tenderness.  Neurological: He is  alert and oriented to person, place, and time.  Fluent speech  Skin: Skin is warm and dry.  Ecchymosis L lower chest wall/L lateral upper abdomen, no crepitus  Psychiatric: He has a normal mood and affect. Judgment normal.  Nursing note and vitals reviewed.    ED Treatments / Results  Labs (all labs ordered are listed, but only abnormal results are displayed) Labs Reviewed  CBC - Abnormal; Notable for the following components:      Result Value   RBC 3.59 (*)    Hemoglobin 12.0 (*)    HCT 36.2 (*)    MCV 100.8 (*)    All other components within normal limits  BASIC METABOLIC PANEL -  Abnormal; Notable for the following components:   Glucose, Bld 109 (*)    All other components within normal limits  URINALYSIS, ROUTINE W REFLEX MICROSCOPIC - Abnormal; Notable for the following components:   Ketones, ur 15 (*)    Nitrite POSITIVE (*)    All other components within normal limits  URINALYSIS, MICROSCOPIC (REFLEX) - Abnormal; Notable for the following components:   Bacteria, UA FEW (*)    All other components within normal limits  URINE CULTURE    EKG None  Radiology Ct Chest W Contrast  Result Date: 03/29/2018 CLINICAL DATA:  Blunt abdominal and chest trauma, fell yesterday striking LEFT chest wall, LEFT side bruising, on Eliquis, history paroxysmal atrial fibrillation, hypertension, GERD EXAM: CT CHEST, ABDOMEN, AND PELVIS WITH CONTRAST TECHNIQUE: Multidetector CT imaging of the chest, abdomen and pelvis was performed following the standard protocol during bolus administration of intravenous contrast. Sagittal and coronal MPR images reconstructed from axial data set. CONTRAST:  189mL ISOVUE-300 IOPAMIDOL (ISOVUE-300) INJECTION 61% IV. No oral contrast. COMPARISON:  None FINDINGS: CT CHEST FINDINGS Cardiovascular: Aorta normal caliber. Scattered atherosclerotic calcifications aorta and coronary arteries. No pericardial effusion. Mediastinum/Nodes: Moderate-sized hiatal hernia.  Esophagus unremarkable. Base of cervical region normal appearance. No thoracic adenopathy. Lungs/Pleura: Minimal dependent atelectasis in the posterior lungs. Subsegmental atelectasis at lower lobes bilaterally greater on LEFT. No acute infiltrate, pleural effusion, or pneumothorax. Calcified granulomata RIGHT middle lobe and LEFT upper lobe. Musculoskeletal: Acute nondisplaced fracture LEFT ninth rib laterally. Healing lateral LEFT tenth rib fracture. CT ABDOMEN PELVIS FINDINGS Hepatobiliary: 7 mm nonspecific low-attenuation focus superiorly in liver. Remainder of liver unremarkable. Gallbladder normal appearance. No biliary dilatation. Pancreas: Normal appearance Spleen: Normal appearance Adrenals/Urinary Tract: Adrenal glands normal appearance. BILATERAL renal cysts. Kidneys, ureters, and bladder otherwise normal appearance. Stomach/Bowel: Stool throughout colon. Appendix not visualized. Moderate hiatal hernia. Stomach and bowel loops otherwise normal appearance. Vascular/Lymphatic: Atherosclerotic calcifications aorta and iliac arteries. Aorta normal caliber. No adenopathy. Reproductive: Prostatic enlargement, gland 5.7 x 4.0 cm Other: No free air or free fluid.  No hernia. Musculoskeletal: Degenerative disc and facet disease changes lumbar spine. Mild retrolisthesis at L1-L2 and T12-L1 with mild anterolisthesis at L5-S1. Multilevel vacuum phenomenon lumbar spine. IMPRESSION: Nondisplaced fracture lateral LEFT ninth rib. Scattered atelectasis in the lower lobes greater on LEFT. No additional intrathoracic, intra-abdominal or intrapelvic acute abnormalities. Prostatic enlargement. Moderate-sized hiatal hernia. Aortic Atherosclerosis (ICD10-I70.0). Electronically Signed   By: Lavonia Dana M.D.   On: 03/29/2018 18:57   Ct Abdomen Pelvis W Contrast  Result Date: 03/29/2018 CLINICAL DATA:  Blunt abdominal and chest trauma, fell yesterday striking LEFT chest wall, LEFT side bruising, on Eliquis, history paroxysmal  atrial fibrillation, hypertension, GERD EXAM: CT CHEST, ABDOMEN, AND PELVIS WITH CONTRAST TECHNIQUE: Multidetector CT imaging of the chest, abdomen and pelvis was performed following the standard protocol during bolus administration of intravenous contrast. Sagittal and coronal MPR images reconstructed from axial data set. CONTRAST:  133mL ISOVUE-300 IOPAMIDOL (ISOVUE-300) INJECTION 61% IV. No oral contrast. COMPARISON:  None FINDINGS: CT CHEST FINDINGS Cardiovascular: Aorta normal caliber. Scattered atherosclerotic calcifications aorta and coronary arteries. No pericardial effusion. Mediastinum/Nodes: Moderate-sized hiatal hernia. Esophagus unremarkable. Base of cervical region normal appearance. No thoracic adenopathy. Lungs/Pleura: Minimal dependent atelectasis in the posterior lungs. Subsegmental atelectasis at lower lobes bilaterally greater on LEFT. No acute infiltrate, pleural effusion, or pneumothorax. Calcified granulomata RIGHT middle lobe and LEFT upper lobe. Musculoskeletal: Acute nondisplaced fracture LEFT ninth rib laterally. Healing lateral LEFT tenth rib fracture. CT ABDOMEN PELVIS FINDINGS Hepatobiliary:  7 mm nonspecific low-attenuation focus superiorly in liver. Remainder of liver unremarkable. Gallbladder normal appearance. No biliary dilatation. Pancreas: Normal appearance Spleen: Normal appearance Adrenals/Urinary Tract: Adrenal glands normal appearance. BILATERAL renal cysts. Kidneys, ureters, and bladder otherwise normal appearance. Stomach/Bowel: Stool throughout colon. Appendix not visualized. Moderate hiatal hernia. Stomach and bowel loops otherwise normal appearance. Vascular/Lymphatic: Atherosclerotic calcifications aorta and iliac arteries. Aorta normal caliber. No adenopathy. Reproductive: Prostatic enlargement, gland 5.7 x 4.0 cm Other: No free air or free fluid.  No hernia. Musculoskeletal: Degenerative disc and facet disease changes lumbar spine. Mild retrolisthesis at L1-L2 and  T12-L1 with mild anterolisthesis at L5-S1. Multilevel vacuum phenomenon lumbar spine. IMPRESSION: Nondisplaced fracture lateral LEFT ninth rib. Scattered atelectasis in the lower lobes greater on LEFT. No additional intrathoracic, intra-abdominal or intrapelvic acute abnormalities. Prostatic enlargement. Moderate-sized hiatal hernia. Aortic Atherosclerosis (ICD10-I70.0). Electronically Signed   By: Lavonia Dana M.D.   On: 03/29/2018 18:57    Procedures Procedures (including critical care time)  Medications Ordered in ED Medications  cefTRIAXone (ROCEPHIN) 1 g in sodium chloride 0.9 % 100 mL IVPB (1 g Intravenous New Bag/Given 03/29/18 1941)  0.9 %  sodium chloride infusion (500 mLs Intravenous New Bag/Given 03/29/18 1940)  iopamidol (ISOVUE-300) 61 % injection 100 mL (100 mLs Intravenous Contrast Given 03/29/18 1836)     Initial Impression / Assessment and Plan / ED Course  I have reviewed the triage vital signs and the nursing notes.  Pertinent labs & imaging results that were available during my care of the patient were reviewed by me and considered in my medical decision making (see chart for details).     Because of location of pain and bruising and the fact that he's on eliquis, recommended CT chest through pelvis to r/o multiple fractures or bleeding.  CT shows single left ninth rib fracture with atelectasis, no other acute findings.  He does have evidence of UTI on his UA, urine culture sent.  Because he has had frequent falls, I recommended treatment as this may be contributing to his problems.  Gave ceftriaxone here and prescription for Keflex.  Also provided with all refill on pain medication to continue to control his rib pain.  Provided with incentive spirometer.  I had a long discussion with family regarding his current anticoagulant use and frequent falls at home.  They will discuss this issue with his PCP.  I have extensively reviewed return precautions regarding the rib fracture  as well as the UTI and patient and family have voiced understanding.  Vital signs reassuring here, normal work of breathing, normal oxygen level therefore I feel outpatient management is appropriate. Final Clinical Impressions(s) / ED Diagnoses   Final diagnoses:  Closed fracture of one rib of left side, initial encounter  Fall in home, initial encounter  Urinary tract infection without hematuria, site unspecified    ED Discharge Orders         Ordered    cephALEXin (KEFLEX) 500 MG capsule  2 times daily     03/29/18 1958    HYDROcodone-acetaminophen (NORCO/VICODIN) 5-325 MG tablet  Every 8 hours PRN     03/29/18 1958           Little, Wenda Overland, MD 03/29/18 2000

## 2018-03-31 LAB — URINE CULTURE

## 2018-04-01 ENCOUNTER — Telehealth: Payer: Self-pay

## 2018-04-01 NOTE — Telephone Encounter (Signed)
[facility] ED 03/29/18 Multiple species Suggest recollection per Salome Arnt Pharm D   D/C on Cephalexin

## 2018-11-30 ENCOUNTER — Other Ambulatory Visit: Payer: Self-pay

## 2019-03-28 ENCOUNTER — Other Ambulatory Visit: Payer: Self-pay

## 2020-04-23 ENCOUNTER — Emergency Department (HOSPITAL_COMMUNITY): Payer: No Typology Code available for payment source

## 2020-04-23 ENCOUNTER — Encounter (HOSPITAL_COMMUNITY): Payer: Self-pay | Admitting: Emergency Medicine

## 2020-04-23 ENCOUNTER — Emergency Department (HOSPITAL_COMMUNITY)
Admission: EM | Admit: 2020-04-23 | Discharge: 2020-04-23 | Disposition: A | Discharge status: Home / Self Care | Payer: No Typology Code available for payment source | Attending: Emergency Medicine | Admitting: Emergency Medicine

## 2020-04-23 DIAGNOSIS — H5460 Unqualified visual loss, one eye, unspecified: Secondary | ICD-10-CM

## 2020-04-23 DIAGNOSIS — Z959 Presence of cardiac and vascular implant and graft, unspecified: Secondary | ICD-10-CM | POA: Diagnosis not present

## 2020-04-23 DIAGNOSIS — H5462 Unqualified visual loss, left eye, normal vision right eye: Secondary | ICD-10-CM | POA: Insufficient documentation

## 2020-04-23 DIAGNOSIS — I129 Hypertensive chronic kidney disease with stage 1 through stage 4 chronic kidney disease, or unspecified chronic kidney disease: Secondary | ICD-10-CM | POA: Insufficient documentation

## 2020-04-23 DIAGNOSIS — H539 Unspecified visual disturbance: Secondary | ICD-10-CM | POA: Diagnosis present

## 2020-04-23 DIAGNOSIS — Z79899 Other long term (current) drug therapy: Secondary | ICD-10-CM | POA: Insufficient documentation

## 2020-04-23 DIAGNOSIS — N182 Chronic kidney disease, stage 2 (mild): Secondary | ICD-10-CM | POA: Insufficient documentation

## 2020-04-23 LAB — CBC
HCT: 37.4 % — ABNORMAL LOW (ref 39.0–52.0)
Hemoglobin: 12.1 g/dL — ABNORMAL LOW (ref 13.0–17.0)
MCH: 32.4 pg (ref 26.0–34.0)
MCHC: 32.4 g/dL (ref 30.0–36.0)
MCV: 100 fL (ref 80.0–100.0)
Platelets: 280 10*3/uL (ref 150–400)
RBC: 3.74 MIL/uL — ABNORMAL LOW (ref 4.22–5.81)
RDW: 13.5 % (ref 11.5–15.5)
WBC: 8.2 10*3/uL (ref 4.0–10.5)
nRBC: 0 % (ref 0.0–0.2)

## 2020-04-23 LAB — DIFFERENTIAL
Abs Immature Granulocytes: 0.01 10*3/uL (ref 0.00–0.07)
Basophils Absolute: 0 10*3/uL (ref 0.0–0.1)
Basophils Relative: 0 %
Eosinophils Absolute: 0.2 10*3/uL (ref 0.0–0.5)
Eosinophils Relative: 2 %
Immature Granulocytes: 0 %
Lymphocytes Relative: 41 %
Lymphs Abs: 3.3 10*3/uL (ref 0.7–4.0)
Monocytes Absolute: 0.5 10*3/uL (ref 0.1–1.0)
Monocytes Relative: 6 %
Neutro Abs: 4.1 10*3/uL (ref 1.7–7.7)
Neutrophils Relative %: 51 %

## 2020-04-23 LAB — COMPREHENSIVE METABOLIC PANEL
ALT: 14 U/L (ref 0–44)
AST: 18 U/L (ref 15–41)
Albumin: 3.8 g/dL (ref 3.5–5.0)
Alkaline Phosphatase: 73 U/L (ref 38–126)
Anion gap: 10 (ref 5–15)
BUN: 19 mg/dL (ref 8–23)
CO2: 24 mmol/L (ref 22–32)
Calcium: 9 mg/dL (ref 8.9–10.3)
Chloride: 106 mmol/L (ref 98–111)
Creatinine, Ser: 1.6 mg/dL — ABNORMAL HIGH (ref 0.61–1.24)
GFR, Estimated: 41 mL/min — ABNORMAL LOW (ref >60)
Glucose, Bld: 130 mg/dL — ABNORMAL HIGH (ref 70–99)
Potassium: 3.9 mmol/L (ref 3.5–5.1)
Sodium: 140 mmol/L (ref 135–145)
Total Bilirubin: 0.6 mg/dL (ref 0.3–1.2)
Total Protein: 6.6 g/dL (ref 6.5–8.1)

## 2020-04-23 LAB — APTT: aPTT: 33 seconds (ref 24–36)

## 2020-04-23 LAB — PROTIME-INR
INR: 1.2 (ref 0.8–1.2)
Prothrombin Time: 14.9 seconds (ref 11.4–15.2)

## 2020-04-23 MED ORDER — IOHEXOL 350 MG/ML SOLN
75.0000 mL | Freq: Once | INTRAVENOUS | Status: AC | PRN
  Administered 2020-04-23: 75 mL via INTRAVENOUS

## 2020-04-23 NOTE — ED Triage Notes (Signed)
Pt states that his bp has been running in the 170s today, reports that he has had floaters in his L visual field since 12/15, states that he has gradually lost approx 60% of the vision in his L eye since the floaters began. Pt a/ox4, speech clear, face symmetrical. Moves all limbs equally.

## 2020-04-23 NOTE — ED Provider Notes (Signed)
Patient seen after prior ED provider.  Patient without evidence of central cause of his vision loss.  Case discussed with Dr. Eulas Post of ophthalmology.  Patient is to be sent from the ED directly to Dr. Roda Shutters office for evaluation this evening at 8 PM.  Patient understands this plan.  He agrees with this plan for additional close outpatient ophthalmology evaluation.   Valarie Merino, MD 04/23/20 [phone]

## 2020-04-23 NOTE — Discharge Instructions (Addendum)
  Please return for any problem.  Go directly from the ED to Dr. Roda Shutters ophthalmology office.  He is located at [address], [address].  His office is with [facility].  You are to be seen at [facility].

## 2020-04-23 NOTE — ED Provider Notes (Signed)
[facility] EMERGENCY DEPARTMENT Provider Note   CSN: [id number] Arrival date & time: 04/23/20  5400     History Chief Complaint  Patient presents with  . Hypertension  . Visual Field Change    William Stein is a 86 y.o. male.  HPI Patient presents with visual change. For around the last week he has had some eye floaters. States they were only in the left eye. Since then he has had gradually decreased vision in the left eye. No pain. No numbness weakness. No headache. No confusion. States now he is down to about 45% of his normal vision in the left eye. States he has had cataract surgery in that eye by Dr. Kathrin Penner and states that the left eye is normal he has good eye. History of paroxysmal A. fib but not currently in A. fib. He is however on anticoagulation.    Past Medical History:  Diagnosis Date  . Bradycardia    a. during 2009, 2/2 combination of diltiazem and BB  . CKD (chronic kidney disease), stage II   . GERD (gastroesophageal reflux disease)   . HLD (hyperlipidemia)   . HTN (hypertension)   . Noncompliance    a. Prior issue - pt more recently endorses compliance with meds.  Marland Kitchen PAF (paroxysmal atrial fibrillation) ([place]) 2007   a. Previously on flecainide. b. 12/2013: s/p DCCV, started on amio. AC w/ pradaxa.    Patient Active Problem List   Diagnosis Date Noted  . Hyperglycemia 01/09/2014  . CKD (chronic kidney disease), stage II 01/09/2014  . PAF (paroxysmal atrial fibrillation) ([place]) 12/17/2013  . Arthritis 10/23/2013  . Noncompliance 11/07/2011  . Essential hypertension, benign 06/06/2009  . BRADYCARDIA 09/23/2008    Past Surgical History:  Procedure Laterality Date  . CARDIAC CATHETERIZATION  2009  . CARDIOVERSION N/A 12/18/2013   Procedure: CARDIOVERSION;  Surgeon: Lelon Perla, MD;  Location: [facility] ENDOSCOPY;  Service: Cardiovascular;  Laterality: N/A;  . COLONOSCOPY  2003  . DOPPLER ECHOCARDIOGRAPHY  2011  . exercise  stress test  2012       Family History  Problem Relation Age of Onset  . Other Mother 76       old age  . Dementia Father 54  . Coronary artery disease Neg Hx     Social History   Tobacco Use  . Smoking status: Never Smoker  . Smokeless tobacco: Never Used  Vaping Use  . Vaping Use: Never used  Substance Use Topics  . Alcohol use: No  . Drug use: No    Home Medications Prior to Admission medications   Medication Sig Start Date End Date Taking? Authorizing Provider  acetaminophen (TYLENOL) 650 MG CR tablet Take 650 mg by mouth See admin instructions. Take 1 in the morning and 1 in the evening. May take additional tablet as needed for pain.   Yes [provider]  amiodarone (PACERONE) 200 MG tablet Take 100 mg by mouth daily.   Yes [provider]  apixaban (ELIQUIS) 5 MG TABS tablet Take 5 mg by mouth 2 (two) times daily.   Yes [provider]  Cholecalciferol (VITAMIN D-3) 1000 UNITS CAPS Take 1,000 Units by mouth daily.    Yes [provider]  DHA-EPA-Flaxseed Oil-Vitamin E (THERA TEARS NUTRITION PO) Place 1 drop into the left eye daily as needed.   Yes [provider]  MELATONIN PO Take 1 tablet by mouth at bedtime as needed (sleep).   Yes [provider]  Menthol, Topical Analgesic, (ASPERCREME MAX ROLL-ON) 16 % LIQD Apply 1 application topically 3 (three) times daily as needed (pain).   Yes [provider]  Multiple Vitamin (MULTIVITAMIN WITH MINERALS) TABS tablet Take 1 tablet by mouth daily.   Yes [provider]  Saline (OCEAN NASAL SPRAY NA) Place 1 spray into the nose 2 (two) times daily as needed.   Yes [provider]  torsemide (DEMADEX) 20 MG tablet Take 20 mg by mouth daily.   Yes [provider]  vitamin C (ASCORBIC ACID) 500 MG tablet Take 500 mg by mouth daily.   Yes [provider]  vitamin E 400 UNIT capsule Take 400 Units by mouth daily.   Yes [provider]  diltiazem (DILACOR XR) 180 MG 24 hr capsule Take 1 capsule (180 mg total) by mouth daily. 11/07/11 06/12/12  Theora Gianotti, NP    Allergies    Patient has no known allergies.  Review of Systems   Review of Systems  Constitutional: Negative for appetite change.  HENT: Negative for congestion.   Eyes: Positive for visual disturbance. Negative for photophobia, redness and itching.  Respiratory: Negative for shortness of breath.   Cardiovascular: Negative for chest pain.  Gastrointestinal: Negative for abdominal distention.  Genitourinary: Negative for flank pain.  Musculoskeletal: Negative for back pain.  Skin: Negative for rash.  Neurological: Negative for weakness.  Psychiatric/Behavioral: Negative for confusion.    Physical Exam Updated Vital Signs BP 119/66   Pulse (!) 57   Temp 98.1 F (36.7 C)   Resp 14   SpO2 99%   Physical Exam Vitals and nursing note reviewed.  Constitutional:      Appearance: Normal appearance.  HENT:     Head: Atraumatic.     Mouth/Throat:     Mouth: Mucous membranes are moist.  Eyes:     General:        Right eye: No discharge.        Left eye: No discharge.     Extraocular Movements: Extraocular movements intact.     Pupils: Pupils are equal, round, and reactive to light.     Comments: Decreased vision in left eye. May have slight loss of visual fields on nasal side of the left eye. Eye movements intact. Patient appears to be less than counting fingers on the left and at reported baseline on the right.  Cardiovascular:     Rate and Rhythm: Regular rhythm.  Pulmonary:     Breath sounds: Normal breath sounds.  Abdominal:     General: There is no distension.     Tenderness: There is no abdominal tenderness.  Musculoskeletal:        General: No tenderness.     Cervical back: Neck supple.  Skin:    General: Skin is warm.     Capillary Refill: Capillary refill takes less than 2 seconds.  Neurological:     Mental  Status: He is alert.     Comments: Decreased vision in left eye.  Psychiatric:        Mood and Affect: Mood normal.     ED Results / Procedures / Treatments   Labs (all labs ordered are listed, but only abnormal results are displayed) Labs Reviewed  CBC - Abnormal; Notable for the following components:      Result Value   RBC 3.74 (*)    Hemoglobin 12.1 (*)    HCT 37.4 (*)    All other components within normal limits  COMPREHENSIVE METABOLIC PANEL - Abnormal; Notable for the following components:   Glucose, Bld 130 (*)    Creatinine, Ser 1.60 (*)    GFR, Estimated 41 (*)    All other components within normal limits  PROTIME-INR  APTT  DIFFERENTIAL    EKG  EKG Interpretation  Date/Time:  Wednesday April 23 2020 08:50:35 EST Ventricular Rate:  70 PR Interval:  172 QRS Duration: 86 QT Interval:  426 QTC Calculation: 460 R Axis:   72 Text Interpretation:  Poor data quality, interpretation may be adversely affected Normal sinus rhythm Possible Anterior infarct , age undetermined Abnormal ECG Confirmed by Davonna Belling [phone]) on 04/23/2020 12:08:01 PM        Radiology CT HEAD WO CONTRAST  Result Date: 04/23/2020 CLINICAL DATA:  Worsening vision loss in the left eye over the past week. EXAM: CT HEAD WITHOUT CONTRAST TECHNIQUE: Contiguous axial images were obtained from the base of the skull through the vertex without intravenous contrast. COMPARISON:  Head CT scan 09/08/2016. FINDINGS: Brain: No evidence of acute infarction, hemorrhage, hydrocephalus, extra-axial collection or mass lesion/mass effect. Atrophy and chronic microvascular ischemic change again seen. Vascular: No hyperdense vessel or unexpected calcification. Skull: Intact.  No focal lesion. Sinuses/Orbits: Status post cataract surgery bilaterally. Otherwise negative. Other: None. IMPRESSION: No acute abnormality or finding to explain the patient's symptoms. Atrophy and chronic microvascular ischemic  change. Electronically Signed   By: Inge Rise M.D.   On: 04/23/2020 09:26    Procedures Procedures (including critical care time)  Medications Ordered in ED Medications - No data to display  ED Course  I have reviewed the triage vital signs and the nursing notes.  Pertinent labs & imaging results that were available during my care of the patient were reviewed by me and considered in my medical decision making (see chart for details).    MDM Rules/Calculators/A&P                          Patient presents with almost a week of vision loss.  States he is down to about 40% of his normal vision.  Left only right eye is unaffected.  Doubt this is acute retinal detachment no weight so it started gradually over the whole eye.  Although he did have some floaters.  Head CT reassuring but with history of A. fib will get CT angiography for further evaluation.  Hopefully will be able to be discharged with ophthalmology follow-up.  Care will be turned over to Dr. Francia Greaves. Final Clinical Impression(s) / ED Diagnoses Final diagnoses:  Monocular vision loss    Rx / DC Orders ED Discharge Orders    None       Davonna Belling, MD 04/23/20 1555

## 2020-04-23 NOTE — ED Notes (Signed)
Patient verbalizes understanding of discharge instructions. Opportunity for questioning and answers were provided. Armband removed by staff, pt discharged from ED ambulatory with walker.
